# Patient Record
Sex: Female | Born: 1955 | Race: White | Hispanic: No | Marital: Married | Smoking: Former smoker
Health system: Southern US, Community
[De-identification: ages and names within clinical notes are randomized; demographics above are authoritative.]

## PROBLEM LIST (undated history)

## (undated) DIAGNOSIS — N6019 Diffuse cystic mastopathy of unspecified breast: Secondary | ICD-10-CM

## (undated) DIAGNOSIS — E669 Obesity, unspecified: Secondary | ICD-10-CM

## (undated) DIAGNOSIS — E785 Hyperlipidemia, unspecified: Secondary | ICD-10-CM

## (undated) DIAGNOSIS — Z96651 Presence of right artificial knee joint: Secondary | ICD-10-CM

## (undated) DIAGNOSIS — I4891 Unspecified atrial fibrillation: Secondary | ICD-10-CM

## (undated) DIAGNOSIS — K219 Gastro-esophageal reflux disease without esophagitis: Secondary | ICD-10-CM

## (undated) DIAGNOSIS — D649 Anemia, unspecified: Secondary | ICD-10-CM

## (undated) DIAGNOSIS — M1990 Unspecified osteoarthritis, unspecified site: Secondary | ICD-10-CM

## (undated) DIAGNOSIS — R5383 Other fatigue: Secondary | ICD-10-CM

## (undated) HISTORY — DX: Other fatigue: R53.83

## (undated) HISTORY — PX: SHOULDER SURGERY: SHX246

## (undated) HISTORY — PX: HAND SURGERY: SHX662

## (undated) HISTORY — DX: Unspecified osteoarthritis, unspecified site: M19.90

## (undated) HISTORY — DX: Anemia, unspecified: D64.9

## (undated) HISTORY — DX: Presence of right artificial knee joint: Z96.651

## (undated) HISTORY — DX: Obesity, unspecified: E66.9

## (undated) HISTORY — PX: KNEE SURGERY: SHX244

## (undated) HISTORY — DX: Hyperlipidemia, unspecified: E78.5

## (undated) HISTORY — PX: BREAST SURGERY: SHX581

## (undated) HISTORY — DX: Gastro-esophageal reflux disease without esophagitis: K21.9

## (undated) HISTORY — PX: OTHER SURGICAL HISTORY: SHX169

---
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
## 1979-10-20 HISTORY — PX: APPENDECTOMY: SHX54

## 1999-10-20 HISTORY — PX: CHOLECYSTECTOMY: SHX55

## 2006-03-22 ENCOUNTER — Ambulatory Visit: Payer: Self-pay | Admitting: Family Medicine

## 2006-04-13 ENCOUNTER — Ambulatory Visit: Payer: Self-pay | Admitting: Family Medicine

## 2006-04-26 ENCOUNTER — Encounter: Admission: RE | Admit: 2006-04-26 | Discharge: 2006-04-26 | Payer: Self-pay | Admitting: Obstetrics and Gynecology

## 2006-05-03 ENCOUNTER — Ambulatory Visit: Payer: Self-pay | Admitting: Gastroenterology

## 2006-05-10 ENCOUNTER — Encounter (INDEPENDENT_AMBULATORY_CARE_PROVIDER_SITE_OTHER): Payer: Self-pay | Admitting: *Deleted

## 2006-05-10 ENCOUNTER — Encounter: Admission: RE | Admit: 2006-05-10 | Discharge: 2006-05-10 | Payer: Self-pay | Admitting: Obstetrics and Gynecology

## 2006-08-16 ENCOUNTER — Encounter: Admission: RE | Admit: 2006-08-16 | Discharge: 2006-08-16 | Payer: Self-pay | Admitting: General Surgery

## 2007-05-02 ENCOUNTER — Encounter: Admission: RE | Admit: 2007-05-02 | Discharge: 2007-05-02 | Payer: Self-pay | Admitting: Obstetrics and Gynecology

## 2008-01-02 ENCOUNTER — Encounter
Admission: RE | Admit: 2008-01-02 | Discharge: 2008-01-02 | Payer: Self-pay | Admitting: Physical Medicine and Rehabilitation

## 2008-05-14 ENCOUNTER — Encounter: Admission: RE | Admit: 2008-05-14 | Discharge: 2008-05-14 | Payer: Self-pay | Admitting: Obstetrics and Gynecology

## 2008-10-23 ENCOUNTER — Emergency Department (HOSPITAL_COMMUNITY): Admission: EM | Admit: 2008-10-23 | Discharge: 2008-10-23 | Payer: Self-pay | Admitting: Family Medicine

## 2009-04-18 LAB — CONVERTED CEMR LAB: Pap Smear: NORMAL

## 2009-05-13 ENCOUNTER — Encounter: Admission: RE | Admit: 2009-05-13 | Discharge: 2009-05-13 | Payer: Self-pay | Admitting: Obstetrics and Gynecology

## 2009-09-23 ENCOUNTER — Ambulatory Visit: Payer: Self-pay | Admitting: Internal Medicine

## 2009-09-23 LAB — CONVERTED CEMR LAB
ALT: 24 units/L (ref 0–35)
AST: 28 units/L (ref 0–37)
Albumin: 3.7 g/dL (ref 3.5–5.2)
Alkaline Phosphatase: 75 units/L (ref 39–117)
BUN: 10 mg/dL (ref 6–23)
Basophils Absolute: 0.1 10*3/uL (ref 0.0–0.1)
Basophils Relative: 0.7 % (ref 0.0–3.0)
Bilirubin Urine: NEGATIVE
Bilirubin, Direct: 0.1 mg/dL (ref 0.0–0.3)
CO2: 29 meq/L (ref 19–32)
Calcium: 8.9 mg/dL (ref 8.4–10.5)
Chloride: 107 meq/L (ref 96–112)
Cholesterol: 192 mg/dL (ref 0–200)
Creatinine, Ser: 0.7 mg/dL (ref 0.4–1.2)
Eosinophils Absolute: 0.2 10*3/uL (ref 0.0–0.7)
Eosinophils Relative: 2.5 % (ref 0.0–5.0)
GFR calc non Af Amer: 93.65 mL/min (ref >60)
Glucose, Bld: 97 mg/dL (ref 70–99)
HCT: 40.8 % (ref 36.0–46.0)
HDL: 54.7 mg/dL (ref >39.00)
Hemoglobin, Urine: NEGATIVE
Hemoglobin: 13.7 g/dL (ref 12.0–15.0)
Ketones, ur: NEGATIVE mg/dL
LDL Cholesterol: 120 mg/dL — ABNORMAL HIGH (ref 0–99)
Leukocytes, UA: NEGATIVE
Lymphocytes Relative: 23.7 % (ref 12.0–46.0)
Lymphs Abs: 1.8 10*3/uL (ref 0.7–4.0)
MCHC: 33.7 g/dL (ref 30.0–36.0)
MCV: 93.3 fL (ref 78.0–100.0)
Monocytes Absolute: 0.5 10*3/uL (ref 0.1–1.0)
Monocytes Relative: 6.3 % (ref 3.0–12.0)
Neutro Abs: 5.1 10*3/uL (ref 1.4–7.7)
Neutrophils Relative %: 66.8 % (ref 43.0–77.0)
Nitrite: NEGATIVE
Platelets: 185 10*3/uL (ref 150.0–400.0)
Potassium: 4.4 meq/L (ref 3.5–5.1)
RBC: 4.37 M/uL (ref 3.87–5.11)
RDW: 13.6 % (ref 11.5–14.6)
Sodium: 142 meq/L (ref 135–145)
Specific Gravity, Urine: 1.025 (ref 1.000–1.030)
TSH: 2 microintl units/mL (ref 0.35–5.50)
Total Bilirubin: 0.5 mg/dL (ref 0.3–1.2)
Total CHOL/HDL Ratio: 4
Total Protein, Urine: NEGATIVE mg/dL
Total Protein: 6.2 g/dL (ref 6.0–8.3)
Triglycerides: 86 mg/dL (ref 0.0–149.0)
Urine Glucose: NEGATIVE mg/dL
Urobilinogen, UA: 0.2 (ref 0.0–1.0)
VLDL: 17.2 mg/dL (ref 0.0–40.0)
WBC: 7.7 10*3/uL (ref 4.5–10.5)
pH: 5.5 (ref 5.0–8.0)

## 2009-09-30 ENCOUNTER — Telehealth: Payer: Self-pay | Admitting: Internal Medicine

## 2009-09-30 ENCOUNTER — Ambulatory Visit: Payer: Self-pay | Admitting: Internal Medicine

## 2009-09-30 DIAGNOSIS — Z87448 Personal history of other diseases of urinary system: Secondary | ICD-10-CM | POA: Insufficient documentation

## 2009-09-30 DIAGNOSIS — Z9189 Other specified personal risk factors, not elsewhere classified: Secondary | ICD-10-CM | POA: Insufficient documentation

## 2009-09-30 DIAGNOSIS — J438 Other emphysema: Secondary | ICD-10-CM | POA: Insufficient documentation

## 2009-09-30 DIAGNOSIS — D649 Anemia, unspecified: Secondary | ICD-10-CM | POA: Insufficient documentation

## 2009-09-30 DIAGNOSIS — E669 Obesity, unspecified: Secondary | ICD-10-CM | POA: Insufficient documentation

## 2009-10-01 ENCOUNTER — Encounter (INDEPENDENT_AMBULATORY_CARE_PROVIDER_SITE_OTHER): Payer: Self-pay | Admitting: *Deleted

## 2009-10-08 ENCOUNTER — Encounter (INDEPENDENT_AMBULATORY_CARE_PROVIDER_SITE_OTHER): Payer: Self-pay | Admitting: *Deleted

## 2009-10-14 ENCOUNTER — Ambulatory Visit: Payer: Self-pay | Admitting: Gastroenterology

## 2009-10-28 ENCOUNTER — Ambulatory Visit: Payer: Self-pay | Admitting: Gastroenterology

## 2010-02-24 ENCOUNTER — Ambulatory Visit: Payer: Self-pay | Admitting: Internal Medicine

## 2010-04-18 LAB — CONVERTED CEMR LAB: Pap Smear: NORMAL

## 2010-04-18 LAB — HM MAMMOGRAPHY: HM Mammogram: NORMAL

## 2010-05-26 ENCOUNTER — Encounter: Admission: RE | Admit: 2010-05-26 | Discharge: 2010-05-26 | Payer: Self-pay | Admitting: Obstetrics and Gynecology

## 2010-05-30 ENCOUNTER — Encounter: Admission: RE | Admit: 2010-05-30 | Discharge: 2010-05-30 | Payer: Self-pay | Admitting: Obstetrics and Gynecology

## 2010-09-29 ENCOUNTER — Ambulatory Visit: Payer: Self-pay | Admitting: Internal Medicine

## 2010-10-06 ENCOUNTER — Ambulatory Visit: Payer: Self-pay | Admitting: Internal Medicine

## 2010-10-27 ENCOUNTER — Other Ambulatory Visit: Payer: Self-pay | Admitting: Internal Medicine

## 2010-10-27 LAB — CBC WITH DIFFERENTIAL/PLATELET
Basophils Absolute: 0 10*3/uL (ref 0.0–0.1)
Basophils Relative: 0.4 % (ref 0.0–3.0)
Eosinophils Absolute: 0.2 10*3/uL (ref 0.0–0.7)
Eosinophils Relative: 3.3 % (ref 0.0–5.0)
HCT: 41.4 % (ref 36.0–46.0)
Hemoglobin: 14.2 g/dL (ref 12.0–15.0)
Lymphocytes Relative: 29.2 % (ref 12.0–46.0)
Lymphs Abs: 2.2 10*3/uL (ref 0.7–4.0)
MCHC: 34.3 g/dL (ref 30.0–36.0)
MCV: 89.8 fl (ref 78.0–100.0)
Monocytes Absolute: 0.5 10*3/uL (ref 0.1–1.0)
Monocytes Relative: 6.2 % (ref 3.0–12.0)
Neutro Abs: 4.5 10*3/uL (ref 1.4–7.7)
Neutrophils Relative %: 60.9 % (ref 43.0–77.0)
Platelets: 229 10*3/uL (ref 150.0–400.0)
RBC: 4.61 Mil/uL (ref 3.87–5.11)
RDW: 13.4 % (ref 11.5–14.6)
WBC: 7.4 10*3/uL (ref 4.5–10.5)

## 2010-10-27 LAB — URINALYSIS, ROUTINE W REFLEX MICROSCOPIC
Bilirubin Urine: NEGATIVE
Hemoglobin, Urine: NEGATIVE
Ketones, ur: NEGATIVE
Nitrite: NEGATIVE
Specific Gravity, Urine: 1.015 (ref 1.000–1.030)
Total Protein, Urine: NEGATIVE
Urine Glucose: NEGATIVE
Urobilinogen, UA: 0.2 (ref 0.0–1.0)
pH: 6 (ref 5.0–8.0)

## 2010-10-27 LAB — BASIC METABOLIC PANEL
BUN: 17 mg/dL (ref 6–23)
CO2: 30 mEq/L (ref 19–32)
Calcium: 9.5 mg/dL (ref 8.4–10.5)
Chloride: 106 mEq/L (ref 96–112)
Creatinine, Ser: 0.8 mg/dL (ref 0.4–1.2)
GFR: 75.56 mL/min (ref >60.00)
Glucose, Bld: 85 mg/dL (ref 70–99)
Potassium: 4.8 mEq/L (ref 3.5–5.1)
Sodium: 142 mEq/L (ref 135–145)

## 2010-10-27 LAB — HEPATIC FUNCTION PANEL
ALT: 22 U/L (ref 0–35)
AST: 22 U/L (ref 0–37)
Albumin: 3.9 g/dL (ref 3.5–5.2)
Alkaline Phosphatase: 88 U/L (ref 39–117)
Bilirubin, Direct: 0.1 mg/dL (ref 0.0–0.3)
Total Bilirubin: 0.7 mg/dL (ref 0.3–1.2)
Total Protein: 6.9 g/dL (ref 6.0–8.3)

## 2010-10-27 LAB — LIPID PANEL
Cholesterol: 230 mg/dL — ABNORMAL HIGH (ref 0–200)
HDL: 81.7 mg/dL (ref >39.00)
Total CHOL/HDL Ratio: 3
Triglycerides: 75 mg/dL (ref 0.0–149.0)
VLDL: 15 mg/dL (ref 0.0–40.0)

## 2010-10-27 LAB — TSH: TSH: 1.51 u[IU]/mL (ref 0.35–5.50)

## 2010-10-27 LAB — LDL CHOLESTEROL, DIRECT: Direct LDL: 141.1 mg/dL

## 2010-11-03 ENCOUNTER — Encounter: Payer: Self-pay | Admitting: Internal Medicine

## 2010-11-12 ENCOUNTER — Telehealth: Payer: Self-pay | Admitting: Internal Medicine

## 2010-11-18 NOTE — Progress Notes (Signed)
Summary: adipex-p  Phone Note Call from Patient   Caller: Patient Summary of Call: Req rx for appetite suppresant. Was rx by previous md. pt not sure of name. Will contact [employer] in [place] for more info Initial call taken by: Orlan Leavens,  September 30, 2009 8:55 AM  Follow-up for Phone Call        prescription for Adipex-p 37.5 done - pt will need to pick yp as can't be called in - thanks Follow-up by: Newt Lukes MD,  September 30, 2009 10:26 AM  Additional Follow-up for Phone Call Additional follow up Details #1::        Called pt no ansew [facility] RTC concerning prescription Additional Follow-up by: Orlan Leavens,  September 30, 2009 11:07 AM    Additional Follow-up for Phone Call Additional follow up Details #2::    Patient return call back. Rx ready for pick-up. will leave @ front desk Follow-up by: Orlan Leavens,  September 30, 2009 11:44 AM

## 2010-11-18 NOTE — Assessment & Plan Note (Signed)
Summary: NEW [employer] PHYSICAL PT--PKG--STC   Vital Signs:  Patient profile:   55 year old female Height:      64.5 inches (163.83 cm) Weight:      195.4 pounds (88.82 kg) BMI:     33.14 O2 Sat:      94 % on Room air Temp:     97.5 degrees F (36.39 degrees C) oral Pulse rate:   61 / minute BP sitting:   110 / 90  (left arm) Cuff size:   large  Vitals Entered By: Orlan Leavens (September 30, 2009 8:22 AM)  O2 Flow:  Room air CC: New patient CPX Is Patient Diabetic? No Pain Assessment Patient in pain? no        Primary Care Provider:  Newt Lukes MD  CC:  New patient CPX.  History of Present Illness: new pt to me and ourpractice - here to est care - patient is also here today for annual physical. Patient feels well and has no complaints.   intrested in "weight loss drug" that she was prescribed this summer - lost 20# with this med (intentional) that she has kept off -  but no longer going to the weight loss clinic as it was not covered by insurance -  still would like to take that med but uncertain what the drug was....  Preventive Screening-Counseling & Management  Alcohol-Tobacco     Alcohol drinks/day: <1     Alcohol Counseling: not indicated; use of alcohol is not excessive or problematic     Smoking Status: quit     Year Quit: 2001  Caffeine-Diet-Exercise     Caffeine use/day: 1-2     Caffeine Counseling: not indicated; caffeine use is not excessive or problematic     Diet Counseling: to improve diet; diet is suboptimal     Does Patient Exercise: no     Exercise Counseling: to improve exercise regimen     Depression Counseling: not indicated; screening negative for depression  Clinical Review Panels:  Prevention   Last Mammogram:  normal (04/18/2009)   Last Pap Smear:  normal (04/18/2009)  Immunizations   Last Tetanus Booster:  Tdap (09/30/2009)  Lipid Management   Cholesterol:  192 (09/23/2009)   LDL (bad choesterol):  120 (09/23/2009)   HDL  (good cholesterol):  16.10 (09/23/2009)  CBC   WBC:  7.7 (09/23/2009)   RBC:  4.37 (09/23/2009)   Hgb:  13.7 (09/23/2009)   Hct:  40.8 (09/23/2009)   Platelets:  185.0 (09/23/2009)   MCV  93.3 (09/23/2009)   MCHC  33.7 (09/23/2009)   RDW  13.6 (09/23/2009)   PMN:  66.8 (09/23/2009)   Lymphs:  23.7 (09/23/2009)   Monos:  6.3 (09/23/2009)   Eosinophils:  2.5 (09/23/2009)   Basophil:  0.7 (09/23/2009)  Complete Metabolic Panel   Glucose:  97 (09/23/2009)   Sodium:  142 (09/23/2009)   Potassium:  4.4 (09/23/2009)   Chloride:  107 (09/23/2009)   CO2:  29 (09/23/2009)   BUN:  10 (09/23/2009)   Creatinine:  0.7 (09/23/2009)   Albumin:  3.7 (09/23/2009)   Total Protein:  6.2 (09/23/2009)   Calcium:  8.9 (09/23/2009)   Total Bili:  0.5 (09/23/2009)   Alk Phos:  75 (09/23/2009)   SGPT (ALT):  24 (09/23/2009)   SGOT (AST):  28 (09/23/2009)   Current Medications (verified): 1)  Icaps  Caps (Multiple Vitamins-Minerals) .... Take 2 By Mouth Qd 2)  Estrace 1 Mg Tabs (Estradiol) .... Take 1  Po Qd  Allergies (verified): No Known Drug Allergies  Past History:  Past Medical History: Anemia-NOS Blood transfusion  MD rooster: gyn- [facility] - [facility]  Past Surgical History: Appendectomy (1610) Cholecystectomy (2001) Tonsillectomy (1972) Breast biopsy (2006)  Family History: Family History of Arthritis (grandparent &parent) Family History Breast cancer 1st degree relative <50 (other relative) Family History of Colon CA 1st degree relative <60 (parent) Family History Diabetes 1st degree relative (parent & grandparent) Family History High cholesterol (parent) Family History Ovarian cancer (other relative) Heart disease (parent)  mom - A&W dad expired age 14yo - DM, brain anyresym sister - CLL dx age 20yo brother - CLL dx age 93yo  Social History: Former Smoker married, lives with spouse (who is being tx for rectal cancer - Sherril) works as Administrator, sports  occasional social alcohol use Smoking Status:  quit Caffeine use/day:  1-2 Does Patient Exercise:  no  Review of Systems       see HPI above. I have reviewed all other systems and they were negative.   Physical Exam  General:  alert, well-developed, well-nourished, and cooperative to examination.   mild-mod overweight appearing Eyes:  vision grossly intact; pupils equal, round and reactive to light.  conjunctiva and lids normal.    Ears:  normal pinnae bilaterally, without erythema, swelling, or tenderness to palpation. TMs clear, without effusion, or cerumen impaction. Hearing grossly normal bilaterally  Mouth:  teeth and gums in good repair; mucous membranes moist, without lesions or ulcers. oropharynx clear without exudate, erythema.  Neck:  supple, full ROM, no masses, no thyromegaly; no thyroid nodules or tenderness. no JVD or carotid bruits.   Lungs:  normal respiratory effort, no intercostal retractions or use of accessory muscles; normal breath sounds bilaterally - no crackles and no wheezes.    Heart:  normal rate, regular rhythm, no murmur, and no rub. BLE without edema. normal DP pulses and normal cap refill in all 4 extremities    Abdomen:  soft, non-tender, normal bowel sounds, no distention; no masses and no appreciable hepatomegaly or splenomegaly.   Genitalia:  defer to gyn Msk:  No deformity or scoliosis noted of thoracic or lumbar spine.   Neurologic:  alert & oriented X3 and cranial nerves II-XII symetrically intact.  strength normal in all extremities, sensation intact to light touch, and gait normal. speech fluent without dysarthria or aphasia; follows commands with good comprehension.  Skin:  no rashes, vesicles, ulcers, or erythema. No nodules or irregularity to palpation.  Psych:  Oriented X3, memory intact for recent and remote, normally interactive, good eye contact, not anxious appearing, not depressed appearing, and not agitated.      Impression &  Recommendations:  Problem # 1:  PREVENTIVE HEALTH CARE (ICD-V70.0) Patient has been counseled on age-appropriate routine health concerns for screening and prevention. These are reviewed and up-to-date. Immunizations are up-to-date or declined. Labs and ECG reviewed.  Orders: EKG w/ Interpretation (93000) Gastroenterology Referral (GI)  Problem # 2:  OBESITY (ICD-278.00) will investigate weight loss rx and rx if approp as per request - if rx, need 6 mo followup to ensure not abused and monitor weight loss efforts also encouraged: it is important that you work on losing weight - monitor your diet and consume fewer calories such as less carbohydrates (sugar) and less fat. you also need to increase your physical activity level - start by walking for 10-20 minutes 3 times per week and work up to 30 minutes 4-5 times each week.  Ht: 64.5 (09/30/2009)   Wt: 195.4 (09/30/2009)   BMI: 33.14 (09/30/2009)  reviewed prev records: ok for adipex-p 37.5 once daily - rx to be picked up  Complete Medication List: 1)  Icaps Caps (Multiple vitamins-minerals) .... Take 2 by mouth qd 2)  Estrace 1 Mg Tabs (Estradiol) .... Take 1 po qd 3)  Phentermine Hcl 37.5 Mg Caps (Phentermine hcl) .Marland Kitchen.. 1 by mouth every morning  Other Orders: Tdap => 62yrs IM ([address]) Admin 1st Vaccine (60454)  Contraindications/Deferment of Procedures/Staging:    Test/Procedure: FLU VAX    Reason for deferment: patient declined   Patient Instructions: 1)  it was good to see you today.  2)  Your exam, labs and EKG (heart) all look good today! 3)  we'll contact [employer] to investigate the previously prescribed weight loss drug and then contact you re: wether or not this is approved here - 4)  it is important that you work on losing weight by monitoring your diet and consuming fewer calories such as less carbohydrates (sugar) and less fat. you also need to increase your physical activity level - start by walking for 10-20 minutes 3  times per week and work up to 30 minutes 4-5 times each week.  5)  we'll make referral to GI for screening colonscopy. Our office will contact you regarding this appointment once made.  6)  It is important that you exercise regularly at least 20 minutes 5 times a week. If you develop chest pain, have severe difficulty breathing, or feel very tired , stop exercising immediately and seek medical attention. 7)  Take calcium +Vitamin D daily. 8)  Please schedule a follow-up appointment in 6 months, sooner if problems.  Prescriptions: PHENTERMINE HCL 37.5 MG CAPS (PHENTERMINE HCL) 1 by mouth every morning  #30 x 5   Entered and Authorized by:   Newt Lukes MD   Signed by:   Newt Lukes MD on 09/30/2009   Method used:   Print then Give to Patient   RxID:   [id number]   Preventive Care Screening  Mammogram:    Date:  04/18/2009    Results:  normal   Pap Smear:    Date:  04/18/2009    Results:  normal     Immunizations Administered:  Tetanus Vaccine:    Vaccine Type: Tdap    Site: right deltoid    Mfr: GlaxoSmithKline    Dose: 0.5 ml    Route: IM    Given by: Orlan Leavens    Exp. Date: 12/14/2011    Lot #: [id number]    VIS given: 09/30/09

## 2010-11-18 NOTE — Procedures (Signed)
Summary: Colonoscopy  Patient: [name] Note: All result statuses are Final unless otherwise noted.  Tests: (1) Colonoscopy (COL)   COL Colonoscopy           DONE     [facility]     [address]     [place], [place]  [address]           COLONOSCOPY PROCEDURE REPORT           PATIENT:  Diane Perkins, Diane Perkins  MR#:  [id number]     BIRTHDATE:  [birth year], 53 yrs. old  GENDER:  female           ENDOSCOPIST:  Barbette Hair. Arlyce Dice, MD     Referred by:  Rene Paci, M.D.           PROCEDURE DATE:  10/28/2009     PROCEDURE:  Colonoscopy, Diagnostic     ASA CLASS:  Class II     INDICATIONS:  Routine Risk Screening           MEDICATIONS:   Fentanyl 100 mcg IV, Versed 9 mg IV, Benadryl 25 mg     IV           DESCRIPTION OF PROCEDURE:   After the risks benefits and     alternatives of the procedure were thoroughly explained, informed     consent was obtained.  Digital rectal exam was performed and     revealed no abnormalities.   The LB CF-[id number] endoscope     was introduced through the anus and advanced to the cecum, which     was identified by both the appendix and ileocecal valve, without     limitations.  The quality of the prep was excellent, using     MoviPrep.  The instrument was then slowly withdrawn as the colon     was fully examined.     <<PROCEDUREIMAGES>>           FINDINGS:  Moderate diverticulosis was found in the sigmoid colon     (see image1).  This was otherwise a normal examination of the     colon (see image3, image4, image6, image9, image10, image13,     image12, image19, and image20).   Retroflexed views in the rectum     revealed not done.    The scope was then withdrawn from the     patient and the procedure completed.           COMPLICATIONS:  None           ENDOSCOPIC IMPRESSION:     1) Moderate diverticulosis in the sigmoid colon     2) Otherwise normal examination     3) Not done     RECOMMENDATIONS:     1) Continue current colorectal  screening recommendations for     "routine risk" patients with a repeat colonoscopy in 10 years.           REPEAT EXAM:  In 10 year(s) for Colonoscopy.           ______________________________     Barbette Hair. Arlyce Dice, MD           CC:           n.     eSIGNED:   Barbette Hair. Etienne Mowers at 10/28/2009 12:21 PM           [place]enson [place], [id number]  Note: An exclamation mark (!) indicates a result that was not dispersed into the flowsheet.  Document Creation Date: 10/28/2009 12:22 PM _______________________________________________________________________  (1) Order result status: Final Collection or observation date-time: 10/28/2009 12:17 Requested date-time:  Receipt date-time:  Reported date-time:  Referring Physician:   Ordering Physician: Melvia Heaps [phone]) Specimen Source:  Source: Launa Grill Order Number: [id number] Lab site:   Appended Document: Colonoscopy    Clinical Lists Changes  Observations: Added new observation of COLONNXTDUE: 10/2019 (10/28/2009 12:28)

## 2010-11-18 NOTE — Assessment & Plan Note (Signed)
Summary: 6 MTH OR SOONER FU  STC   Vital Signs:  Patient profile:   55 year old female Height:      64.5 inches (163.83 cm) Weight:      179.0 pounds (81.36 kg) BMI:     30.36 O2 Sat:      96 % on Room air Temp:     98.1 degrees F (36.72 degrees C) oral Pulse rate:   80 / minute BP sitting:   102 / 82  (left arm) Cuff size:   regular  Vitals Entered By: Orlan Leavens (Feb 24, 2010 1:08 PM)  O2 Flow:  Room air CC: 6 month follow-up Is Patient Diabetic? No Pain Assessment Patient in pain? no        Primary Care Provider:  Newt Lukes MD  CC:  6 month follow-up.  History of Present Illness: f/u obesity -  has lost 15lbs since 09/2009 with help of phenteramine - denies adv se trying to focus on "good carbs"  Clinical Review Panels:  Prevention   Last Mammogram:  normal (04/18/2009)   Last Pap Smear:  normal (04/18/2009)   Last Colonoscopy:  DONE (10/28/2009)  Lipid Management   Cholesterol:  192 (09/23/2009)   LDL (bad choesterol):  120 (09/23/2009)   HDL (good cholesterol):  54.70 (09/23/2009)  CBC   WBC:  7.7 (09/23/2009)   RBC:  4.37 (09/23/2009)   Hgb:  13.7 (09/23/2009)   Hct:  40.8 (09/23/2009)   Platelets:  185.0 (09/23/2009)   MCV  93.3 (09/23/2009)   MCHC  33.7 (09/23/2009)   RDW  13.6 (09/23/2009)   PMN:  66.8 (09/23/2009)   Lymphs:  23.7 (09/23/2009)   Monos:  6.3 (09/23/2009)   Eosinophils:  2.5 (09/23/2009)   Basophil:  0.7 (09/23/2009)  Complete Metabolic Panel   Glucose:  97 (09/23/2009)   Sodium:  142 (09/23/2009)   Potassium:  4.4 (09/23/2009)   Chloride:  107 (09/23/2009)   CO2:  29 (09/23/2009)   BUN:  10 (09/23/2009)   Creatinine:  0.7 (09/23/2009)   Albumin:  3.7 (09/23/2009)   Total Protein:  6.2 (09/23/2009)   Calcium:  8.9 (09/23/2009)   Total Bili:  0.5 (09/23/2009)   Alk Phos:  75 (09/23/2009)   SGPT (ALT):  24 (09/23/2009)   SGOT (AST):  28 (09/23/2009)   Current Medications (verified): 1)  Icaps  Caps  (Multiple Vitamins-Minerals) .... Take 2 By Mouth Qd 2)  Estrace 1 Mg Tabs (Estradiol) .... Take 1/2 By Mouth Once Daily 3)  Phentermine Hcl 37.5 Mg Caps (Phentermine Hcl) .Marland Kitchen.. 1 By Mouth Every Morning  Allergies (verified): No Known Drug Allergies  Past History:  Past Medical History: Anemia-NOS  MD rooster: gyn- [facility]  Social History: Reviewed history from 09/30/2009 and no changes required. Former Smoker married, lives with spouse (who is being tx for rectal cancer - Sherril) works as Administrator, sports occasional social alcohol use  Review of Systems  The patient denies anorexia, weight gain, chest pain, syncope, and depression.    Physical Exam  General:  alert, well-developed, well-nourished, and cooperative to examination.   mild overweight appearing Lungs:  normal respiratory effort, no intercostal retractions or use of accessory muscles; normal breath sounds bilaterally - no crackles and no wheezes.    Heart:  normal rate, regular rhythm, no murmur, and no rub. BLE without edema. normal DP pulses and normal cap refill in all 4 extremities      Impression &  Recommendations:  Problem # 1:  OBESITY (ICD-278.00)  s/p 6 mo tx phenteramine  stop at this time as long term effects unknown i offered nutritionist referral - pt declines also encouraged: it is important that you continue top work on losing weight - monitor your diet and consume fewer calories such as less carbohydrates (sugar) and less fat. you also need to increase your physical activity level - continue walking for 10-20 minutes 3 times per week and work up to 30 minutes 4-5 times each week.  Ht: 64.5 (09/30/2009)   Wt: 195.4 (09/30/2009)   BMI: 33.14 (09/30/2009)  Ht: 64.5 (02/24/2010)   Wt: 179.0 (02/24/2010)   BMI: 30 (02/24/2010)  Complete Medication List: 1)  Icaps Caps (Multiple vitamins-minerals) .... Take 2 by mouth qd 2)  Estrace 1 Mg Tabs (Estradiol) .... Take 1/2 by  mouth once daily  Patient Instructions: 1)  it was good to see you today.  2)  stop phenteramine at this time - continue to focus on your diet and exercise habits - let us know if you are interested in a nutrition referral 3)  Please schedule a follow-up appointment in 6 months for your medical physical, sooner if problems.

## 2010-11-18 NOTE — Letter (Signed)
Summary: [facility] Instructions  [facility]  [address] [place], [place] [address]   Phone: [phone]  Fax: [phone]       Diane Perkins    1956/03/10    MRN: [id number]        Procedure Day /Date: 10/28/2009  Monday     Arrival Time: 10:30am      Procedure Time: 11:30am     Location of Procedure:                    _x _  [facility] (4th Floor)                        PREPARATION FOR COLONOSCOPY WITH MOVIPREP   Starting 5 days prior to your procedure 01/05/11do not eat nuts, seeds, popcorn, corn, beans, peas,  salads, or any raw vegetables.  Do not take any fiber supplements (e.g. Metamucil, Citrucel, and Benefiber).  THE DAY BEFORE YOUR PROCEDURE         DATE: 10/27/2009  DAY: Sunday  1.  Drink clear liquids the entire day-NO SOLID FOOD  2.  Do not drink anything colored red or purple.  Avoid juices with pulp.  No orange juice.  3.  Drink at least 64 oz. (8 glasses) of fluid/clear liquids during the day to prevent dehydration and help the prep work efficiently.  CLEAR LIQUIDS INCLUDE: Water Jello Ice Popsicles Tea (sugar ok, no milk/cream) Powdered fruit flavored drinks Coffee (sugar ok, no milk/cream) Gatorade Juice: apple, white grape, white cranberry  Lemonade Clear bullion, consomm, broth Carbonated beverages (any kind) Strained chicken noodle soup Hard Candy                             4.  In the morning, mix first dose of MoviPrep solution:    Empty 1 Pouch A and 1 Pouch B into the disposable container    Add lukewarm drinking water to the top line of the container. Mix to dissolve    Refrigerate (mixed solution should be used within 24 hrs)  5.  Begin drinking the prep at 5:00 p.m. The MoviPrep container is divided by 4 marks.   Every 15 minutes drink the solution down to the next mark (approximately 8 oz) until the full liter is complete.   6.  Follow completed prep with 16 oz of clear liquid of your choice  (Nothing red or purple).  Continue to drink clear liquids until bedtime.  7.  Before going to bed, mix second dose of MoviPrep solution:    Empty 1 Pouch A and 1 Pouch B into the disposable container    Add lukewarm drinking water to the top line of the container. Mix to dissolve    Refrigerate  THE DAY OF YOUR PROCEDURE      DATE: 10/28/09 DAY: Monday  Beginning at 6:30 a.m. (5 hours before procedure):         1. Every 15 minutes, drink the solution down to the next mark (approx 8 oz) until the full liter is complete.  2. Follow completed prep with 16 oz. of clear liquid of your choice.    3. You may drink clear liquids until  9:30am (2 HOURS BEFORE PROCEDURE).   MEDICATION INSTRUCTIONS  Unless otherwise instructed, you should take regular prescription medications with a small sip of water   as early as possible the morning of your  procedure.           OTHER INSTRUCTIONS  You will need a responsible adult at least 53 years of age to accompany you and drive you home.   This person must remain in the waiting room during your procedure.  Wear loose fitting clothing that is easily removed.  Leave jewelry and other valuables at home.  However, you may wish to bring a book to read or  an iPod/MP3 player to listen to music as you wait for your procedure to start.  Remove all body piercing jewelry and leave at home.  Total time from sign-in until discharge is approximately 2-3 hours.  You should go home directly after your procedure and rest.  You can resume normal activities the  day after your procedure.  The day of your procedure you should not:   Drive   Make legal decisions   Operate machinery   Drink alcohol   Return to work  You will receive specific instructions about eating, activities and medications before you leave.    The above instructions have been reviewed and explained to me by   Sherren Kerns RN  October 14, 2009 1:06 PM     I fully  understand and can verbalize these instructions _____________________________ Date _________

## 2010-11-18 NOTE — Letter (Signed)
Summary: Previsit letter  [facility]  [address] [place], [place] [address]   Phone: [phone]  Fax: [phone]       10/01/2009 MRN: [id number]  Diane Perkins [address] [place], [place]  [address]  Dear Ms. Guilliams,  Welcome to the Gastroenterology Division at [facility].    You are scheduled to see a nurse for your pre-procedure visit on 10/14/09 at 1:00p.m. on the 3rd floor at [facility], [address]  We ask that you try to arrive at our office 15 minutes prior to your appointment time to allow for check-in.  Your nurse visit will consist of discussing your medical and surgical history, your immediate family medical history, and your medications.    Please bring a complete list of all your medications or, if you prefer, bring the medication bottles and we will list them.  We will need to be aware of both prescribed and over the counter drugs.  We will need to know exact dosage information as well.  If you are on blood thinners (Coumadin, Plavix, Aggrenox, Ticlid, etc.) please call our office today/prior to your appointment, as we need to consult with your physician about holding your medication.   Please be prepared to read and sign documents such as consent forms, a financial agreement, and acknowledgement forms.  If necessary, and with your consent, a friend or relative is welcome to sit-in on the nurse visit with you.  Please bring your insurance card so that we may make a copy of it.  If your insurance requires a referral to see a specialist, please bring your referral form from your primary care physician.  No co-pay is required for this nurse visit.     If you cannot keep your appointment, please call [phone] to cancel or reschedule prior to your appointment date.  This allows [place] the opportunity to schedule an appointment for another patient in need of care.    Thank you for choosing [facility] for your medical  needs.  We appreciate the opportunity to care for you.  Please visit [place] at our website  to learn more about our practice.                     Sincerely.                                                                                                                   The Gastroenterology Division

## 2010-11-20 NOTE — Progress Notes (Signed)
Summary: Rx req?  Phone Note Call from Patient Call back at Home Phone [phone]   Caller: Patient Summary of Call: Pt called stating she has started an low calorie diet and an exercise program. Pt says she was told that VAL would prescribe Rx to "boost metabolism" once she started diet and exercise, please advise. [employer] [place] Initial call taken by: Margaret Pyle, CMA,  November 12, 2010 2:16 PM  Follow-up for Phone Call        needs appt/OV to further discuss poss med rx (phentermine) due to controlled subst nature and review of poss risk/SE - thx Follow-up by: Newt Lukes MD,  November 12, 2010 3:55 PM  Additional Follow-up for Phone Call Additional follow up Details #1::        Pt advised via VM to make ROV to discuss Rx Additional Follow-up by: Margaret Pyle, CMA,  November 13, 2010 8:17 AM

## 2010-11-20 NOTE — Assessment & Plan Note (Signed)
Summary: PHYSICAL--STC   Vital Signs:  Patient profile:   55 year old female Height:      64.5 inches (163.83 cm) Weight:      205.2 pounds (93.27 kg) O2 Sat:      99 % on Room air Temp:     97.9 degrees F (36.61 degrees C) oral Pulse rate:   63 / minute BP sitting:   100 / 82  (left arm) Cuff size:   large  Vitals Entered By: Orlan Leavens RMA (November 03, 2010 9:28 AM)  O2 Flow:  Room air CC: CPX Is Patient Diabetic? No Pain Assessment Patient in pain? no        Primary Care Provider:  Newt Lukes MD  CC:  CPX.  History of Present Illness: patient is here today for annual physical. Patient feels well and has no complaints.   Preventive Screening-Counseling & Management  Alcohol-Tobacco     Alcohol drinks/day: <1     Alcohol Counseling: not indicated; use of alcohol is not excessive or problematic     Smoking Status: quit     Year Quit: 2001     Tobacco Counseling: to remain off tobacco products  Caffeine-Diet-Exercise     Caffeine use/day: 1-2     Caffeine Counseling: not indicated; caffeine use is not excessive or problematic     Diet Counseling: to improve diet; diet is suboptimal     Does Patient Exercise: no     Exercise Counseling: to improve exercise regimen     Depression Counseling: not indicated; screening negative for depression  Safety-Violence-Falls     Seat Belt Counseling: not indicated; patient wears seat belts     Helmet Counseling: not indicated; patient wears helmet when riding bicycle/motocycle     Violence Counseling: not indicated; no violence risk noted     Fall Risk Counseling: not indicated; no significant falls noted  Clinical Review Panels:  Prevention   Last Mammogram:  No specific mammographic evidence of malignancy.  [facility]    (04/18/2010)   Last Pap Smear:  normal (taavon) (04/18/2010)   Last Colonoscopy:  DONE (10/28/2009)  Immunizations   Last Tetanus Booster:  Tdap  (09/30/2009)  Lipid Management   Cholesterol:  230 (10/27/2010)   LDL (bad choesterol):  120 (09/23/2009)   HDL (good cholesterol):  81.70 (10/27/2010)  CBC   WBC:  7.4 (10/27/2010)   RBC:  4.61 (10/27/2010)   Hgb:  14.2 (10/27/2010)   Hct:  41.4 (10/27/2010)   Platelets:  229.0 (10/27/2010)   MCV  89.8 (10/27/2010)   MCHC  34.3 (10/27/2010)   RDW  13.4 (10/27/2010)   PMN:  60.9 (10/27/2010)   Lymphs:  29.2 (10/27/2010)   Monos:  6.2 (10/27/2010)   Eosinophils:  3.3 (10/27/2010)   Basophil:  0.4 (10/27/2010)  Complete Metabolic Panel   Glucose:  85 (10/27/2010)   Sodium:  142 (10/27/2010)   Potassium:  4.8 (10/27/2010)   Chloride:  106 (10/27/2010)   CO2:  30 (10/27/2010)   BUN:  17 (10/27/2010)   Creatinine:  0.8 (10/27/2010)   Albumin:  3.9 (10/27/2010)   Total Protein:  6.9 (10/27/2010)   Calcium:  9.5 (10/27/2010)   Total Bili:  0.7 (10/27/2010)   Alk Phos:  88 (10/27/2010)   SGPT (ALT):  22 (10/27/2010)   SGOT (AST):  22 (10/27/2010)   Current Medications (verified): 1)  Icaps  Caps (Multiple Vitamins-Minerals) .... Take 2 By Mouth Qd  Allergies (verified): No Known  Drug Allergies  Past History:  Past medical, surgical, family and social histories (including risk factors) reviewed, and no changes noted (except as noted below).  Past Medical History: Anemia-NOS  MD roster: gyn- [facility] - [facility]  Past Surgical History: Reviewed history from 09/30/2009 and no changes required. Appendectomy (1610) Cholecystectomy (2001) Tonsillectomy (1972) Breast biopsy (2006)  Family History: Reviewed history from 09/30/2009 and no changes required. Family History of Arthritis (grandparent &parent) Family History Breast cancer 1st degree relative <50 (other relative) Family History of Colon CA 1st degree relative <60 (parent) Family History Diabetes 1st degree relative (parent & grandparent) Family History High cholesterol (parent) Family History  Ovarian cancer (other relative) Heart disease (parent)  mom - A&W dad expired age 69yo - DM, brain anuersym sister - CLL dx age 18yo  brother - CLL dx age 47yo   Social History: Reviewed history from 09/30/2009 and no changes required. Former Smoker, quit 2001 married, lives with spouse (who is s/p tx rectal cancer - Sherril) works as Administrator, sports occasional social alcohol use  Review of Systems       see HPI above. I have reviewed all other systems and they were negative.   Physical Exam  General:  alert, well-developed, well-nourished, and cooperative to examination.   overweight appearing Head:  Normocephalic and atraumatic without obvious abnormalities. No apparent alopecia or balding. Eyes:  vision grossly intact; pupils equal, round and reactive to light.  conjunctiva and lids normal.    Ears:  normal pinnae bilaterally, without erythema, swelling, or tenderness to palpation. TMs clear, without effusion, or cerumen impaction. Hearing grossly normal bilaterally  Mouth:  teeth and gums in good repair; mucous membranes moist, without lesions or ulcers. oropharynx clear without exudate, no erythema.  Neck:  supple, full ROM, no masses, no thyromegaly; no thyroid nodules or tenderness. no JVD or carotid bruits.   Lungs:  normal respiratory effort, no intercostal retractions or use of accessory muscles; normal breath sounds bilaterally - no crackles and no wheezes.    Heart:  normal rate, regular rhythm, no murmur, and no rub. BLE without edema. normal DP pulses and normal cap refill in all 4 extremities    Abdomen:  soft, non-tender, normal bowel sounds, no distention; no masses and no appreciable hepatomegaly or splenomegaly.   Genitalia:  defer to gyn Msk:  No deformity or scoliosis noted of thoracic or lumbar spine.   Neurologic:  alert & oriented X3 and cranial nerves II-XII symetrically intact.  strength normal in all extremities, sensation intact to light touch, and gait  normal. speech fluent without dysarthria or aphasia; follows commands with good comprehension.  Skin:  no rashes, vesicles, ulcers, or erythema. No nodules or irregularity to palpation.  Psych:  Oriented X3, memory intact for recent and remote, normally interactive, good eye contact, not anxious appearing, not depressed appearing, and not agitated.      Impression & Recommendations:  Problem # 1:  PREVENTIVE HEALTH CARE (ICD-V70.0) Patient has been counseled on age-appropriate routine health concerns for screening and prevention. These are reviewed and up-to-date. Immunizations are up-to-date or declined. Labs and ECG reviewed.  Orders: EKG w/ Interpretation (93000)  Complete Medication List: 1)  Icaps Caps (Multiple vitamins-minerals) .... Take 2 by mouth qd  Patient Instructions: 1)  it was good to see you today.  2)  exam and EKG (heart) looks good - and labs reviewed - these look good too 3)  continue to focus on your diet and exercise habits -  let us know if you are interested in a nutrition referral 4)  Please schedule a follow-up appointment annually for your medical physical and labs, call sooner if problems.    Orders Added: 1)  EKG w/ Interpretation [93000] 2)  Est. Patient 40-64 years [99396]     Pap Smear  Procedure date:  04/18/2010  Findings:      normal (taavon)  Mammogram  Procedure date:  04/18/2010  Findings:      No specific mammographic evidence of malignancy.  Location: [facility].

## 2011-03-06 NOTE — Assessment & Plan Note (Signed)
[facility]                           GASTROENTEROLOGY OFFICE NOTE   Diane Perkins, Diane Perkins                        MRN:          [id number]  DATE:05/03/2006                            DOB:          1956/05/17    PROBLEM:  Reflux.  Mrs. Diane Perkins is a pleasant 55 year old white female referred  through the courtesy of Dr. Laury Axon for evaluation.  For years she has been  taking Prevacid for indigestion.  Over the last six to seven months symptoms  have worsened, particularly at night after retiring.  She has been sleeping  in a recliner for the past three months because of symptoms.  She has  complained of right chest pain and pain across her upper abdomen.  She no  longer has heartburn, per se.  She denies dysphagia, hoarseness, or sore  throat.   PAST MEDICAL HISTORY:  Remarkable for spinal fusion at L4-5 and S1.  She is  status post cholecystectomy, tubal ligation, and appendectomy.   FAMILY HISTORY:  Pertinent for father with heart disease.   MEDICATIONS:  Prevacid and Femhrt and Equate allergy medicine.   She has no allergies.  She neither smokes nor drinks.  She is married.  Works as a Production designer, theatre/television/film at a [employer].   REVIEW OF SYSTEMS:  Was reviewed and was negative.   PHYSICAL EXAMINATION:  VITAL SIGNS:  Pulse 78, blood pressure 110/74, weight  169.   HEENT: EOMI. PERRLA. Sclerae are anicteric.  Conjunctivae are pink.   NECK:  Supple without thyromegaly, adenopathy or carotid bruits.   CHEST:  Clear to auscultation and percussion without adventitious sounds.   CARDIAC:  Regular rhythm; normal S1 S2.  There are no murmurs, gallops or  rubs.   ABDOMEN:  Bowel sounds are normoactive.  Abdomen is soft, non-tender and non-  distended.  There are no abdominal masses, tenderness, splenic enlargement  or hepatomegaly.   EXTREMITIES:  Full range of motion.  No cyanosis, clubbing or edema.   RECTAL:  Deferred.   IMPRESSION:  Longstanding  gastroesophageal reflux disease with breakthrough  nocturnal symptoms.  Barrett's esophagus ought to be ruled out.   RECOMMENDATION:  1.  Trial of Zegerid 40 mg nightly.  2.  Upper endoscopy.                                   Barbette Hair. Arlyce Dice, MD, [facility]   RDK/MedQ  DD:  05/03/2006  DT:  05/03/2006  Job #:  [id number]   cc:   Loreen Freud, MD  Lenoard Aden, MD

## 2011-05-11 ENCOUNTER — Other Ambulatory Visit: Payer: Self-pay | Admitting: Obstetrics and Gynecology

## 2011-05-11 DIAGNOSIS — Z1231 Encounter for screening mammogram for malignant neoplasm of breast: Secondary | ICD-10-CM

## 2011-06-01 ENCOUNTER — Ambulatory Visit: Payer: Self-pay

## 2011-06-02 ENCOUNTER — Encounter: Payer: Self-pay | Admitting: Internal Medicine

## 2011-06-03 ENCOUNTER — Encounter: Payer: Self-pay | Admitting: Sports Medicine

## 2011-06-03 ENCOUNTER — Ambulatory Visit
Admission: RE | Admit: 2011-06-03 | Discharge: 2011-06-03 | Disposition: A | Discharge status: Home / Self Care | Payer: Managed Care, Other (non HMO) | Source: Ambulatory Visit | Attending: Obstetrics and Gynecology | Admitting: Obstetrics and Gynecology

## 2011-06-03 ENCOUNTER — Ambulatory Visit (INDEPENDENT_AMBULATORY_CARE_PROVIDER_SITE_OTHER): Payer: Managed Care, Other (non HMO) | Admitting: Sports Medicine

## 2011-06-03 VITALS — BP 141/87 | Ht 65.0 in | Wt 195.0 lb

## 2011-06-03 DIAGNOSIS — M6749 Ganglion, multiple sites: Secondary | ICD-10-CM | POA: Insufficient documentation

## 2011-06-03 DIAGNOSIS — Z1231 Encounter for screening mammogram for malignant neoplasm of breast: Secondary | ICD-10-CM

## 2011-06-03 DIAGNOSIS — M6789 Other specified disorders of synovium and tendon, multiple sites: Secondary | ICD-10-CM | POA: Insufficient documentation

## 2011-06-03 NOTE — Assessment & Plan Note (Addendum)
This is too small and too close to the posterior tibialis tendon to aspirate and inject. She may use compression wrap that she has at home. She is also to take her meloxicam or ibuprofen daily. I also provided her with scaphoid pads to place in her shoe. I would like her to wear the compression for 8 weeks, before reassessment.

## 2011-06-03 NOTE — Progress Notes (Signed)
  Subjective:    Patient ID: Diane Perkins, female    DOB: Dec 09, 1955, 55 y.o.   MRN: [id number]  HPI The patient comes to see [place] as a referral from Dr. Thurston Hole with a three-month history of right medial ankle pain and swelling over the malleolus. He evaluated her with x-rays which were negative and she was referred here for imaging to determine if a ganglion cyst was present. She was prescribed Mobic which she has not yet picked up and we takes ibuprofen as needed.  Past medical history: Reviewed and no changes needed Past surgical history: Reviewed and no changes needed Social history: No alcohol, tobacco, or drugs. Family history: Non-contributory Allergies: No known drug allergies. Medications: Reviewed and no changes needed   Review of Systems    no fevers, chills, night sweats, weight loss Objective:   Physical Exam General: Well-developed, Caucasian female in no acute distress. Skin: Warm and dry. Musculoskeletal exam:  Right Ankle: There is prominence of the medial malleolus, this is tender to palpation. I note no subluxation of the posterior tibialis tendon when the ankle was taken through the range of motion. Range of motion is full in all directions. Strength is 5/5 in all directions. Stable lateral and medial ligaments; squeeze test and kleiger test unremarkable; Talar dome nontender; No pain at base of 5th MT; No tenderness over cuboid; No tenderness over N spot or navicular prominence No tenderness on posterior aspects of lateral and medial malleolus No sign of peroneal tendon subluxations or tenderness to palpation Negative tarsal tunnel tinel's  Musculoskeletal ultrasound: We imaged the medial malleolus, Posterior tibialis tendon, Flexor hallucis longus, and flexor digitorum longus tendons. I did note a cystic area consistent with a ganglion cyst that appeared to be originating from the posterior tibialis tendon. This measured 0.78 x 0.12 cm. It was compressible.  We follow the posterior tibialis tendon to its insertion in the navicular bone and noted no abnormalities. There were also some spurs noted along the medial malleolus. The retinaculum was intact. Images were saved  She did have some prominence of the medial malleolus on inspection, as well as some flattening of the right arch. We provided the patient with scaphoid pads to put in her shoes.    Assessment & Plan:

## 2012-04-20 ENCOUNTER — Telehealth: Payer: Self-pay

## 2012-04-20 MED ORDER — PREDNISONE (PAK) 10 MG PO TABS: 10.0000 mg | ORAL_TABLET | ORAL | Status: DC

## 2012-04-20 NOTE — Telephone Encounter (Signed)
Notified pt rx sent to pharmacy... 04/20/12@5 :20pm/LMB

## 2012-04-20 NOTE — Telephone Encounter (Signed)
pred pk - ex done Use calamine lotion OTC as needed

## 2012-04-20 NOTE — Telephone Encounter (Signed)
Pt called requesting Rx for poison ivy rash on all extremities and neck. Last OV 12/201, please advise.

## 2012-05-02 ENCOUNTER — Encounter: Payer: Self-pay | Admitting: Internal Medicine

## 2012-05-02 ENCOUNTER — Ambulatory Visit (INDEPENDENT_AMBULATORY_CARE_PROVIDER_SITE_OTHER): Payer: Managed Care, Other (non HMO) | Admitting: Internal Medicine

## 2012-05-02 VITALS — BP 110/72 | HR 66 | Temp 97.0°F | Ht 64.5 in | Wt 213.1 lb

## 2012-05-02 DIAGNOSIS — L255 Unspecified contact dermatitis due to plants, except food: Secondary | ICD-10-CM

## 2012-05-02 MED ORDER — METHYLPREDNISOLONE ACETATE 80 MG/ML IJ SUSP
120.0000 mg | Freq: Once | INTRAMUSCULAR | Status: AC
  Administered 2012-05-02: 120 mg via INTRAMUSCULAR

## 2012-05-02 MED ORDER — PREDNISONE (PAK) 10 MG PO TABS: 10.0000 mg | ORAL_TABLET | ORAL | Status: AC

## 2012-05-02 NOTE — Patient Instructions (Signed)
It was good to see you today. IM Medrol today and repeat pred pak Please schedule followup as needed, call sooner if problems.  Contact Dermatitis Contact dermatitis is a reaction to certain substances that touch the skin. Contact dermatitis can be either irritant contact dermatitis or allergic contact dermatitis. Irritant contact dermatitis does not require previous exposure to the substance for a reaction to occur. Allergic contact dermatitis only occurs if you have been exposed to the substance before. Upon a repeat exposure, your body reacts to the substance.   CAUSES   Many substances can cause contact dermatitis. Irritant dermatitis is most commonly caused by repeated exposure to mildly irritating substances, such as:  Makeup.   Soaps.   Detergents.   Bleaches.   Acids.   Metal salts, such as nickel.  Allergic contact dermatitis is most commonly caused by exposure to:  Poisonous plants.   Chemicals (deodorants, shampoos).   Jewelry.   Latex.   Neomycin in triple antibiotic cream.   Preservatives in products, including clothing.  SYMPTOMS   The area of skin that is exposed may develop:  Dryness or flaking.   Redness.   Cracks.   Itching.   Pain or a burning sensation.   Blisters.  With allergic contact dermatitis, there may also be swelling in areas such as the eyelids, mouth, or genitals.   DIAGNOSIS   Your caregiver can usually tell what the problem is by doing a physical exam. In cases where the cause is uncertain and an allergic contact dermatitis is suspected, a patch skin test may be performed to help determine the cause of your dermatitis. TREATMENT Treatment includes protecting the skin from further contact with the irritating substance by avoiding that substance if possible. Barrier creams, powders, and gloves may be helpful. Your caregiver may also recommend:  Steroid creams or ointments applied 2 times daily. For best results, soak the rash area in  cool water for 20 minutes. Then apply the medicine. Cover the area with a plastic wrap. You can store the steroid cream in the refrigerator for a "chilly" effect on your rash. That may decrease itching. Oral steroid medicines may be needed in more severe cases.   Antibiotics or antibacterial ointments if a skin infection is present.   Antihistamine lotion or an antihistamine taken by mouth to ease itching.   Lubricants to keep moisture in your skin.   Burow's solution to reduce redness and soreness or to dry a weeping rash. Mix one packet or tablet of solution in 2 cups cool water. Dip a clean washcloth in the mixture, wring it out a bit, and put it on the affected area. Leave the cloth in place for 30 minutes. Do this as often as possible throughout the day.   Taking several cornstarch or baking soda baths daily if the area is too large to cover with a washcloth.  Harsh chemicals, such as alkalis or acids, can cause skin damage that is like a burn. You should flush your skin for 15 to 20 minutes with cold water after such an exposure. You should also seek immediate medical care after exposure. Bandages (dressings), antibiotics, and pain medicine may be needed for severely irritated skin.   HOME CARE INSTRUCTIONS  Avoid the substance that caused your reaction.   Keep the area of skin that is affected away from hot water, soap, sunlight, chemicals, acidic substances, or anything else that would irritate your skin.   Do not scratch the rash. Scratching may cause the  rash to become infected.   You may take cool baths to help stop the itching.   Only take over-the-counter or prescription medicines as directed by your caregiver.   See your caregiver for follow-up care as directed to make sure your skin is healing properly.  SEEK MEDICAL CARE IF:    Your condition is not better after 3 days of treatment.   You seem to be getting worse.   You see signs of infection such as swelling,  tenderness, redness, soreness, or warmth in the affected area.   You have any problems related to your medicines.  Document Released: 10/02/2000 Document Revised: 09/24/2011 Document Reviewed: 03/10/2011 [facility] Patient Information 2012 [place], [place].

## 2012-05-02 NOTE — Progress Notes (Signed)
  Subjective:    Patient ID: Diane Perkins, female    DOB: 12-28-1955, 56 y.o.   MRN: [id number]  HPI  complains of posion ivy Hx same Improved with pred pak last week but not resolved - increase in itch and redness  Past Medical History  Diagnosis Date  . ANEMIA-NOS   . OBESITY     Review of Systems  Constitutional: Negative for fever and fatigue.       Objective:   Physical Exam BP 110/72  Pulse 66  Temp 97 F (36.1 C) (Oral)  Ht 5' 4.5" (1.638 m)  Wt 213 lb 1.9 oz (96.671 kg)  BMI 36.02 kg/m2  SpO2 95% Wt Readings from Last 3 Encounters:  05/02/12 213 lb 1.9 oz (96.671 kg)  06/03/11 195 lb (88.451 kg)  11/03/10 205 lb 3.2 oz (93.078 kg)   Constitutional: She appears well-developed and well-nourished. No distress. spouse at side Cardiovascular: Normal rate, regular rhythm and normal heart sounds.  No murmur heard. No BLE edema. Pulmonary/Chest: Effort normal and breath sounds normal. No respiratory distress. She has no wheezes.  Skin: rash consistent with poison ivy dermatitis - legs/aras, neck and face - mild edema. No cellulitis.  Psychiatric: She has a normal mood and affect. Her behavior is normal. Judgment and thought content normal.   Lab Results  Component Value Date   WBC 7.4 10/27/2010   HGB 14.2 10/27/2010   HCT 41.4 10/27/2010   PLT 229.0 10/27/2010   GLUCOSE 85 10/27/2010   CHOL 230* 10/27/2010   TRIG 75.0 10/27/2010   HDL 81.70 10/27/2010   LDLDIRECT 141.1 10/27/2010   LDLCALC 120* 09/23/2009   ALT 22 10/27/2010   AST 22 10/27/2010   NA 142 10/27/2010   K 4.8 10/27/2010   CL 106 10/27/2010   CREATININE 0.8 10/27/2010   BUN 17 10/27/2010   CO2 30 10/27/2010   TSH 1.51 10/27/2010        Assessment & Plan:  poison ivy dermatitis - education provided on same IM medrol today, repeat pred pak

## 2012-05-16 ENCOUNTER — Other Ambulatory Visit: Payer: Self-pay | Admitting: Obstetrics and Gynecology

## 2012-05-16 DIAGNOSIS — Z1231 Encounter for screening mammogram for malignant neoplasm of breast: Secondary | ICD-10-CM

## 2012-06-06 ENCOUNTER — Ambulatory Visit: Payer: Managed Care, Other (non HMO)

## 2012-07-11 ENCOUNTER — Ambulatory Visit: Payer: Managed Care, Other (non HMO)

## 2012-07-18 ENCOUNTER — Ambulatory Visit
Admission: RE | Admit: 2012-07-18 | Discharge: 2012-07-18 | Disposition: A | Discharge status: Home / Self Care | Payer: Managed Care, Other (non HMO) | Source: Ambulatory Visit | Attending: Obstetrics and Gynecology | Admitting: Obstetrics and Gynecology

## 2012-07-18 DIAGNOSIS — Z1231 Encounter for screening mammogram for malignant neoplasm of breast: Secondary | ICD-10-CM

## 2013-06-27 ENCOUNTER — Other Ambulatory Visit: Payer: Self-pay

## 2013-06-27 DIAGNOSIS — Z1231 Encounter for screening mammogram for malignant neoplasm of breast: Secondary | ICD-10-CM

## 2013-07-24 ENCOUNTER — Ambulatory Visit
Admission: RE | Admit: 2013-07-24 | Discharge: 2013-07-24 | Disposition: A | Discharge status: Home / Self Care | Payer: No Typology Code available for payment source | Source: Ambulatory Visit

## 2013-07-24 DIAGNOSIS — Z1231 Encounter for screening mammogram for malignant neoplasm of breast: Secondary | ICD-10-CM

## 2014-05-28 ENCOUNTER — Encounter: Payer: Self-pay | Admitting: Gastroenterology

## 2014-07-02 ENCOUNTER — Other Ambulatory Visit: Payer: Self-pay

## 2014-07-02 DIAGNOSIS — Z1231 Encounter for screening mammogram for malignant neoplasm of breast: Secondary | ICD-10-CM

## 2014-07-30 ENCOUNTER — Ambulatory Visit
Admission: RE | Admit: 2014-07-30 | Discharge: 2014-07-30 | Disposition: A | Discharge status: Home / Self Care | Payer: PRIVATE HEALTH INSURANCE | Source: Ambulatory Visit

## 2014-07-30 DIAGNOSIS — Z1231 Encounter for screening mammogram for malignant neoplasm of breast: Secondary | ICD-10-CM

## 2014-08-20 ENCOUNTER — Encounter: Payer: Self-pay | Admitting: Family

## 2014-08-20 ENCOUNTER — Other Ambulatory Visit (INDEPENDENT_AMBULATORY_CARE_PROVIDER_SITE_OTHER): Payer: PRIVATE HEALTH INSURANCE

## 2014-08-20 ENCOUNTER — Ambulatory Visit (INDEPENDENT_AMBULATORY_CARE_PROVIDER_SITE_OTHER): Payer: PRIVATE HEALTH INSURANCE | Admitting: Family

## 2014-08-20 VITALS — BP 120/84 | HR 62 | Temp 97.7°F | Resp 18 | Ht 65.0 in | Wt 232.1 lb

## 2014-08-20 DIAGNOSIS — L659 Nonscarring hair loss, unspecified: Secondary | ICD-10-CM | POA: Insufficient documentation

## 2014-08-20 DIAGNOSIS — R5383 Other fatigue: Secondary | ICD-10-CM

## 2014-08-20 LAB — CBC
HCT: 42.7 % (ref 36.0–46.0)
Hemoglobin: 13.8 g/dL (ref 12.0–15.0)
MCHC: 32.3 g/dL (ref 30.0–36.0)
MCV: 85 fl (ref 78.0–100.0)
Platelets: 207 10*3/uL (ref 150.0–400.0)
RBC: 5.02 Mil/uL (ref 3.87–5.11)
RDW: 14.3 % (ref 11.5–15.5)
WBC: 7.4 10*3/uL (ref 4.0–10.5)

## 2014-08-20 LAB — BASIC METABOLIC PANEL
BUN: 10 mg/dL (ref 6–23)
CO2: 23 mEq/L (ref 19–32)
Calcium: 9.4 mg/dL (ref 8.4–10.5)
Chloride: 105 mEq/L (ref 96–112)
Creatinine, Ser: 0.8 mg/dL (ref 0.4–1.2)
GFR: 76.59 mL/min (ref >60.00)
Glucose, Bld: 88 mg/dL (ref 70–99)
Potassium: 4.6 mEq/L (ref 3.5–5.1)
Sodium: 140 mEq/L (ref 135–145)

## 2014-08-20 NOTE — Progress Notes (Signed)
   Subjective:    Patient ID: Diane Perkins, female    DOB: [birth year], 58 y.o.   MRN: [id number]  Chief Complaint  Patient presents with  . Follow-up    Hair loss and fatigue x1 month, would like thyroid panel as well and A1c if possible    HPI:  Diane Perkins is a 58 y.o. female who presents today for fatigue.   1) Fatigue - Has been tired lately. Denies shortness of breath. Denies any change in stressors or mood. Describes how she is constantly drinking. Denies excessive urination or hunger. Has been noticed to bruise easily.   2) Hair Loss - More than half of her hair on top has fallen out starting about 3 weeks ago. Has not changed any soaps, detergents or shampoos. Denies any changes to skin or nails. Has to keep her hair well-conditioned otherwise it is more dry and brittle.   No Known Allergies  No current outpatient prescriptions on file prior to visit.   No current facility-administered medications on file prior to visit.   Review of Systems    See HPI Objective:    BP 120/84 mmHg  Pulse 62  Temp(Src) 97.7 F (36.5 C) (Oral)  Resp 18  Ht 5\' 5"  (1.651 m)  Wt 232 lb 1.9 oz (105.289 kg)  BMI 38.63 kg/m2  SpO2 96% Nursing note and vital signs reviewed.  Physical Exam  Constitutional: She is oriented to person, place, and time. She appears well-developed and well-nourished. No distress.  HENT:  Head: Hair is abnormal (Hair is soft and appears thin on top of head.).  Cardiovascular: Normal rate, regular rhythm, normal heart sounds and intact distal pulses.   Pulmonary/Chest: Effort normal and breath sounds normal.  Neurological: She is alert and oriented to person, place, and time.  Skin: Skin is warm and dry. No cyanosis. Nails show no clubbing.  Nails appear with no abnormalities.   Psychiatric: She has a normal mood and affect. Her behavior is normal. Judgment and thought content normal.       Assessment & Plan:

## 2014-08-20 NOTE — Assessment & Plan Note (Signed)
Recent onset of fatigue. Will check CBC, TSH and BMET. Information sheet on generalized fatigue provided to patient. Follow up pending lab work results.

## 2014-08-20 NOTE — Patient Instructions (Signed)
Thank you for choosing [facility].  Summary/Instructions:   Please stop by the lab prior to leaving  We will be in touch with the results when they become available.  Follow up dependent upon results.    Fatigue Fatigue is a feeling of tiredness, lack of energy, lack of motivation, or feeling tired all the time. Having enough rest, good nutrition, and reducing stress will normally reduce fatigue. Consult your caregiver if it persists. The nature of your fatigue will help your caregiver to find out its cause. The treatment is based on the cause.  CAUSES  There are many causes for fatigue. Most of the time, fatigue can be traced to one or more of your habits or routines. Most causes fit into one or more of three general areas. They are: Lifestyle problems  Sleep disturbances.  Overwork.  Physical exertion.  Unhealthy habits.  Poor eating habits or eating disorders.  Alcohol and/or drug use .  Lack of proper nutrition (malnutrition). Psychological problems  Stress and/or anxiety problems.  Depression.  Grief.  Boredom. Medical Problems or Conditions  Anemia.  Pregnancy.  Thyroid gland problems.  Recovery from major surgery.  Continuous pain.  Emphysema or asthma that is not well controlled  Allergic conditions.  Diabetes.  Infections (such as mononucleosis).  Obesity.  Sleep disorders, such as sleep apnea.  Heart failure or other heart-related problems.  Cancer.  Kidney disease.  Liver disease.  Effects of certain medicines such as antihistamines, cough and cold remedies, prescription pain medicines, heart and blood pressure medicines, drugs used for treatment of cancer, and some antidepressants. SYMPTOMS  The symptoms of fatigue include:   Lack of energy.  Lack of drive (motivation).  Drowsiness.  Feeling of indifference to the surroundings. DIAGNOSIS  The details of how you feel help guide your caregiver in finding out what  is causing the fatigue. You will be asked about your present and past health condition. It is important to review all medicines that you take, including prescription and non-prescription items. A thorough exam will be done. You will be questioned about your feelings, habits, and normal lifestyle. Your caregiver may suggest blood tests, urine tests, or other tests to look for common medical causes of fatigue.  TREATMENT  Fatigue is treated by correcting the underlying cause. For example, if you have continuous pain or depression, treating these causes will improve how you feel. Similarly, adjusting the dose of certain medicines will help in reducing fatigue.  HOME CARE INSTRUCTIONS   Try to get the required amount of good sleep every night.  Eat a healthy and nutritious diet, and drink enough water throughout the day.  Practice ways of relaxing (including yoga or meditation).  Exercise regularly.  Make plans to change situations that cause stress. Act on those plans so that stresses decrease over time. Keep your work and personal routine reasonable.  Avoid street drugs and minimize use of alcohol.  Start taking a daily multivitamin after consulting your caregiver. SEEK MEDICAL CARE IF:   You have persistent tiredness, which cannot be accounted for.  You have fever.  You have unintentional weight loss.  You have headaches.  You have disturbed sleep throughout the night.  You are feeling sad.  You have constipation.  You have dry skin.  You have gained weight.  You are taking any new or different medicines that you suspect are causing fatigue.  You are unable to sleep at night.  You develop any unusual swelling of your legs or  other parts of your body. SEEK IMMEDIATE MEDICAL CARE IF:   You are feeling confused.  Your vision is blurred.  You feel faint or pass out.  You develop severe headache.  You develop severe abdominal, pelvic, or back pain.  You develop chest  pain, shortness of breath, or an irregular or fast heartbeat.  You are unable to pass a normal amount of urine.  You develop abnormal bleeding such as bleeding from the rectum or you vomit blood.  You have thoughts about harming yourself or committing suicide.  You are worried that you might harm someone else. MAKE SURE YOU:   Understand these instructions.  Will watch your condition.  Will get help right away if you are not doing well or get worse. Document Released: 08/02/2007 Document Revised: 12/28/2011 Document Reviewed: 02/06/2014 [facility] Patient Information 2015 [place] [place]. This information is not intended to replace advice given to you by your health care provider. Make sure you discuss any questions you have with your health care provider.

## 2014-08-20 NOTE — Assessment & Plan Note (Signed)
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 Will check TSH and testosterone levels.

## 2014-08-20 NOTE — Progress Notes (Signed)
Pre visit review using our clinic review tool, if applicable. No additional management support is needed unless otherwise documented below in the visit note. 

## 2014-08-21 ENCOUNTER — Encounter: Payer: Self-pay | Admitting: Family

## 2014-08-21 LAB — TESTOSTERONE, FREE, TOTAL, SHBG
Sex Hormone Binding: 46 nmol/L (ref 18–114)
Testosterone, Free: 5.5 pg/mL (ref 0.6–6.8)
Testosterone-% Free: 1.5 % (ref 0.4–2.4)
Testosterone: 38 ng/dL (ref 10–70)

## 2014-08-21 LAB — TSH: TSH: 2.37 u[IU]/mL (ref 0.35–4.50)

## 2014-08-21 LAB — TESTOSTERONE: Testosterone: 31.28 ng/dL (ref 15.00–40.00)

## 2014-11-30 ENCOUNTER — Telehealth: Payer: Self-pay

## 2014-11-30 NOTE — Telephone Encounter (Signed)
LVM for pt to call back.   RE: Flu vaccine for 2015/2016 

## 2015-04-30 ENCOUNTER — Other Ambulatory Visit: Payer: Self-pay

## 2015-04-30 DIAGNOSIS — Z1231 Encounter for screening mammogram for malignant neoplasm of breast: Secondary | ICD-10-CM

## 2015-08-05 ENCOUNTER — Ambulatory Visit
Admission: RE | Admit: 2015-08-05 | Discharge: 2015-08-05 | Disposition: A | Discharge status: Home / Self Care | Payer: PRIVATE HEALTH INSURANCE | Source: Ambulatory Visit

## 2015-08-05 DIAGNOSIS — Z1231 Encounter for screening mammogram for malignant neoplasm of breast: Secondary | ICD-10-CM

## 2019-07-04 ENCOUNTER — Encounter (INDEPENDENT_AMBULATORY_CARE_PROVIDER_SITE_OTHER): Payer: BC Managed Care – PPO | Admitting: Ophthalmology

## 2019-07-04 ENCOUNTER — Other Ambulatory Visit: Payer: Self-pay

## 2019-07-04 DIAGNOSIS — H2513 Age-related nuclear cataract, bilateral: Secondary | ICD-10-CM

## 2019-07-04 DIAGNOSIS — H43813 Vitreous degeneration, bilateral: Secondary | ICD-10-CM

## 2019-07-04 DIAGNOSIS — H353132 Nonexudative age-related macular degeneration, bilateral, intermediate dry stage: Secondary | ICD-10-CM | POA: Diagnosis not present

## 2020-01-01 ENCOUNTER — Encounter (INDEPENDENT_AMBULATORY_CARE_PROVIDER_SITE_OTHER): Payer: BC Managed Care – PPO | Admitting: Ophthalmology

## 2020-04-26 ENCOUNTER — Other Ambulatory Visit: Payer: Self-pay | Admitting: Gynecology

## 2020-04-26 DIAGNOSIS — Z1231 Encounter for screening mammogram for malignant neoplasm of breast: Secondary | ICD-10-CM

## 2020-05-28 ENCOUNTER — Other Ambulatory Visit: Payer: Self-pay

## 2020-05-28 ENCOUNTER — Ambulatory Visit
Admission: RE | Admit: 2020-05-28 | Discharge: 2020-05-28 | Disposition: A | Discharge status: Home / Self Care | Payer: BC Managed Care – PPO | Source: Ambulatory Visit | Attending: Gynecology | Admitting: Gynecology

## 2020-05-28 DIAGNOSIS — Z1231 Encounter for screening mammogram for malignant neoplasm of breast: Secondary | ICD-10-CM

## 2020-05-28 HISTORY — DX: Diffuse cystic mastopathy of unspecified breast: N60.19

## 2020-11-18 ENCOUNTER — Other Ambulatory Visit: Payer: Self-pay

## 2020-11-18 ENCOUNTER — Emergency Department (HOSPITAL_COMMUNITY): Payer: BC Managed Care – PPO

## 2020-11-18 ENCOUNTER — Encounter (HOSPITAL_COMMUNITY): Payer: Self-pay | Admitting: Emergency Medicine

## 2020-11-18 ENCOUNTER — Emergency Department (HOSPITAL_COMMUNITY)
Admission: EM | Admit: 2020-11-18 | Discharge: 2020-11-18 | Disposition: A | Discharge status: Home / Self Care | Payer: BC Managed Care – PPO | Attending: Emergency Medicine | Admitting: Emergency Medicine

## 2020-11-18 ENCOUNTER — Telehealth: Payer: Self-pay

## 2020-11-18 DIAGNOSIS — Z87891 Personal history of nicotine dependence: Secondary | ICD-10-CM | POA: Diagnosis not present

## 2020-11-18 DIAGNOSIS — Z8616 Personal history of COVID-19: Secondary | ICD-10-CM | POA: Diagnosis not present

## 2020-11-18 DIAGNOSIS — I4891 Unspecified atrial fibrillation: Secondary | ICD-10-CM | POA: Insufficient documentation

## 2020-11-18 DIAGNOSIS — R0789 Other chest pain: Secondary | ICD-10-CM | POA: Insufficient documentation

## 2020-11-18 DIAGNOSIS — Z7901 Long term (current) use of anticoagulants: Secondary | ICD-10-CM | POA: Diagnosis not present

## 2020-11-18 DIAGNOSIS — M549 Dorsalgia, unspecified: Secondary | ICD-10-CM | POA: Insufficient documentation

## 2020-11-18 HISTORY — DX: Unspecified atrial fibrillation: I48.91

## 2020-11-18 LAB — CBC
HCT: 42.5 % (ref 36.0–46.0)
Hemoglobin: 13.7 g/dL (ref 12.0–15.0)
MCH: 28.7 pg (ref 26.0–34.0)
MCHC: 32.2 g/dL (ref 30.0–36.0)
MCV: 88.9 fL (ref 80.0–100.0)
Platelets: 206 10*3/uL (ref 150–400)
RBC: 4.78 MIL/uL (ref 3.87–5.11)
RDW: 13.7 % (ref 11.5–15.5)
WBC: 7.5 10*3/uL (ref 4.0–10.5)
nRBC: 0 % (ref 0.0–0.2)

## 2020-11-18 LAB — BASIC METABOLIC PANEL
Anion gap: 10 (ref 5–15)
BUN: 13 mg/dL (ref 8–23)
CO2: 23 mmol/L (ref 22–32)
Calcium: 9.4 mg/dL (ref 8.9–10.3)
Chloride: 106 mmol/L (ref 98–111)
Creatinine, Ser: 0.72 mg/dL (ref 0.44–1.00)
GFR, Estimated: >60 mL/min (ref >60)
Glucose, Bld: 103 mg/dL — ABNORMAL HIGH (ref 70–99)
Potassium: 4.2 mmol/L (ref 3.5–5.1)
Sodium: 139 mmol/L (ref 135–145)

## 2020-11-18 LAB — TROPONIN I (HIGH SENSITIVITY)
Troponin I (High Sensitivity): 2 ng/L (ref <18)
Troponin I (High Sensitivity): 3 ng/L (ref <18)

## 2020-11-18 MED ORDER — CYCLOBENZAPRINE HCL 10 MG PO TABS: 10.0000 mg | ORAL_TABLET | Freq: Two times a day (BID) | ORAL | 0 refills | Status: DC | PRN

## 2020-11-18 NOTE — Discharge Instructions (Addendum)
Please follow closely with your primary care. Continue treating your symptoms with over-the-counter medications as needed. Drink plenty of water. You can take flexeril every 12 hours as needed for muscle spasm. Return to the ED if you develop recurrent persisting chest pain, pain that is worse with breathing, chest pain with exertion with associated nausea/sweating/shortness of breath, or for any other concerning symptoms.

## 2020-11-18 NOTE — ED Triage Notes (Signed)
Pt states she woke up with right  Upper chest pain yesterday. Pt recently had covid on the 11th. Pt states since then she has been feeling better. Pt went to urgent care and had an xray that showed pneumonia. Pt has hx of afib.

## 2020-11-18 NOTE — ED Notes (Signed)
Patient verbalizes understanding of discharge instructions. Opportunity for questioning and answers were provided. Armband removed by staff, pt discharged from ED ambulatory.   

## 2020-11-18 NOTE — Telephone Encounter (Signed)
NOTES ON FILE FROM [facility] [phone], SENT REFERRAL TO SCHEDULING

## 2020-11-18 NOTE — ED Provider Notes (Signed)
[facility] EMERGENCY DEPARTMENT Provider Note   CSN: [id number] Arrival date & time: 11/18/20  1128     History Chief Complaint  Patient presents with  . Chest Pain    Diane Perkins is a 65 y.o. female with past medical history of atrial fibrillation on Eliquis, recent COVID-19 illness, presenting to the emergency department for evaluation of chest pain.  Patient states she woke up yesterday morning and noticed a pain in the right side of her chest.  She states it felt like a pulled muscle.  It was worse with movement of her neck and arm in certain directions.  It radiated to her back, which she also felt pain in her back with movement of her neck and arm.  She has not felt any shortness of breath, diaphoresis or nausea with the pain. It is not pleuritic.  It resolved with Tylenol.  She has minimal soreness today.  She states throughout the day today she has been having some indigestion feeling that feels similar to when she is in and out of atrial fibrillation, though her smart watch shows normal heart rates, no rapid rates.  She has not missed any doses of her Eliquis.  No new unilateral leg pain or swelling, no history of DVT or PE.  She went to an urgent care yesterday who did a chest x-ray which showed findings suspicious for a pneumonia.  They instructed her to come to the ED for evaluation of possible blood clot.  She states she was sure she did not have a blood clot therefore did not come to the ED until today for a second opinion. She states her symptoms of COVID have nearly resolved. She has had some ongoing mild SOB with exertion since her COVID illness though that has not worsened in any way, she did not notice SOB while waiting in the waiting room today. She was diagnoses with COVID on Jan 11.    No history of CAD.  The history is provided by the patient.       Past Medical History:  Diagnosis Date  . A-fib (HCC)   . ANEMIA-NOS   . Fibrocystic breast     Right Breast-hx of cysts-benign  . OBESITY     Patient Active Problem List   Diagnosis Date Noted  . Hair loss 08/20/2014  . Fatigue 08/20/2014  . Ganglion cyst of the right posterior tibialis tendon 06/03/2011  . OBESITY 09/30/2009  . ANEMIA-NOS 09/30/2009  . EMPHYSEMA 09/30/2009  . CHICKENPOX, HX OF 09/30/2009    Past Surgical History:  Procedure Laterality Date  . APPENDECTOMY  1981  . BREAST SURGERY     breast biopsy  . CHOLECYSTECTOMY  2001  . TONSILLECTOMY  1972     OB History   No obstetric history on file.     Family History  Problem Relation Age of Onset  . Diabetes Father   . Arthritis Other        grandparent, parent  . Breast cancer Other        other relative  . Colon cancer Other        parent  . Hyperlipidemia Other   . Heart disease Other     Social History   Tobacco Use  . Smoking status: Former Smoker    Types: Cigarettes    Quit date: 10/20/1999    Years since quitting: 21.0  . Smokeless tobacco: Never Used  . Tobacco comment: Married, lives with spouse (who is  s/p tx rectal cancer-[place])  Substance Use Topics  . Alcohol use: Yes    Comment: occassional  . Drug use: No    Home Medications Prior to Admission medications   Medication Sig Start Date End Date Taking? Authorizing Provider  ascorbic acid (VITAMIN C) 500 MG tablet Take 500-1,000 mg by mouth daily.   Yes [provider]  Biotin 10 MG CAPS Take 10 mg by mouth daily with breakfast.   Yes [provider]  Cholecalciferol (VITAMIN D3) 50 MCG (2000 UT) TABS Take 2,000 Units by mouth daily.   Yes [provider]  cyclobenzaprine (FLEXERIL) 10 MG tablet Take 1 tablet (10 mg total) by mouth 2 (two) times daily as needed for muscle spasms. 11/18/20  Yes Roxan Hockey, [place] N, PA-C  ELIQUIS 5 MG TABS tablet Take 5 mg by mouth 2 (two) times daily. 11/04/20  Yes [provider]  loratadine (CLARITIN) 10 MG tablet Take 10 mg by mouth daily.   Yes [provider]  Magnesium 500 MG TABS Take 500 mg by mouth daily after breakfast.   Yes [provider]  Melatonin 10 MG TABS Take 10 mg by mouth at bedtime.   Yes [provider]  metoprolol tartrate (LOPRESSOR) 25 MG tablet Take 25 mg by mouth 2 (two) times daily. 11/04/20  Yes [provider]  Multiple Vitamins-Minerals (ICAPS AREDS FORMULA PO) Take 1 capsule by mouth daily with breakfast.   Yes [provider]  Multiple Vitamins-Minerals (ONE-A-DAY WOMENS 50 PLUS PO) Take 1 tablet by mouth daily with breakfast.   Yes [provider]  rosuvastatin (CRESTOR) 10 MG tablet Take 10 mg by mouth every evening.   Yes [provider]  zinc gluconate 50 MG tablet Take 50 mg by mouth daily with breakfast.   Yes [provider]    Allergies    Patient has no known allergies.  Review of Systems   Review of Systems  Cardiovascular: Positive for chest pain.  All other systems reviewed and are negative.   Physical Exam Updated Vital Signs BP 130/82   Pulse 60   Temp (!) 97.5 F (36.4 C)   Resp 13   Ht 5\' 5"  (1.651 m)   Wt 102.1 kg   SpO2 97%   BMI 37.44 kg/m   Physical Exam Vitals and nursing note reviewed.  Constitutional:      General: She is not in acute distress.    Appearance: She is well-developed and well-nourished. She is not ill-appearing.  HENT:     Head: Normocephalic and atraumatic.  Eyes:     Conjunctiva/sclera: Conjunctivae normal.  Cardiovascular:     Rate and Rhythm: Normal rate and regular rhythm.     Comments: Radial pulses equal b/l Pulmonary:     Effort: Pulmonary effort is normal. No respiratory distress.     Breath sounds: Normal breath sounds.  Chest:     Chest wall: No tenderness.  Abdominal:     Palpations: Abdomen is soft.  Musculoskeletal:        General: No tenderness (no calf TTP).     Right lower leg: No edema.     Left lower leg: No edema.  Skin:    General: Skin is warm.   Neurological:     Mental Status: She is alert.  Psychiatric:        Mood and Affect: Mood and affect normal.        Behavior: Behavior normal.     ED  Results / Procedures / Treatments   Labs (all labs ordered are listed, but only abnormal results are displayed) Labs Reviewed  BASIC METABOLIC PANEL - Abnormal; Notable for the following components:      Result Value   Glucose, Bld 103 (*)    All other components within normal limits  CBC  TROPONIN I (HIGH SENSITIVITY)  TROPONIN I (HIGH SENSITIVITY)    EKG EKG Interpretation  Date/Time:  Monday November 18 2020 11:59:13 EST Ventricular Rate:  63 PR Interval:  158 QRS Duration: 74 QT Interval:  410 QTC Calculation: 419 R Axis:   35 Text Interpretation: Normal sinus rhythm Low voltage QRS Cannot rule out Anterior infarct , age undetermined Abnormal ECG NO STEMI Confirmed by Alvester Chou [phone]) on 11/18/2020 10:55:50 PM   Radiology DG Chest 2 View  Result Date: 11/18/2020 CLINICAL DATA:  Chest pain EXAM: CHEST - 2 VIEW COMPARISON:  08/16/2006 FINDINGS: The heart size and mediastinal contours are within normal limits. Both lungs are clear. The visualized skeletal structures are unremarkable. IMPRESSION: No active cardiopulmonary disease. Electronically Signed   By: Duanne Guess D.O.   On: 11/18/2020 12:37    Procedures Procedures   Medications Ordered in ED Medications - No data to display  ED Course  I have reviewed the triage vital signs and the nursing notes.  Pertinent labs & imaging results that were available during my care of the patient were reviewed by me and considered in my medical decision making (see chart for details).    MDM Rules/Calculators/A&P                          Patient presenting for evaluation after an episode of chest pain that she noticed upon awakening yesterday morning. States it felt like a pulled muscle, as if she slept wrong.  Her symptoms were made worse with particular  movements of her arm and turning her head, felt like it was pulling a muscle and causing the sharp pain.  She also felt the same soreness in her back.  She treated with Tylenol and symptoms are nearly resolved.  She went to urgent care yesterday who did a chest x-ray which showed findings concerning for pneumonia and they recommended she come to the ED for evaluation of possible blood clot.  She however is compliant with her Eliquis, has not missed any doses of this.    She has no secondary findings concerning for DVT.  Her vital signs are stable, she is not tachycardic or tachypneic, she is not hypoxic.  She is very well-appearing on exam.  Her work-up today is reassuring, normal and flat troponins, EKG is nonischemic, chest x-ray is clear.  Basic labs are unremarkable. D-dimer not ordered as will likely be elevated with recent COVID 19 illness.   Presentation does not seem consistent with ACS, low hear score of 2.  Considering PE, had shared decision making with patient.  Patient is compliant on her Eliquis, her symptoms are nearly resolved, they were reproducible with particular movements of her arm and neck which is more consistent with a chest wall pain.  She is not showing any other indications of PE with normal VS, normal troponins, no signs of DVT. Low risk wells. Seems unlikely PE at this time. Patient feels comfortable with discharge to home with PCP follow-up.  She is instructed to continue taking her Eliquis as prescribed.  She seems reliable for follow-up and to return if symptoms change or  worsen in any way.  Patient is discharged in no acute distress.  Discussed results, findings, treatment and follow up. Patient advised of return precautions. Patient verbalized understanding and agreed with plan.  Final Clinical Impression(s) / ED Diagnoses Final diagnoses:  Chest wall pain    Rx / DC Orders ED Discharge Orders         Ordered    cyclobenzaprine (FLEXERIL) 10 MG tablet  2 times daily  PRN        11/18/20 2246           Estoria Geary, [place] N, PA-C 11/18/20 2256    Terald Sleeper, MD 11/19/20 0100

## 2020-11-20 ENCOUNTER — Telehealth: Payer: Self-pay

## 2020-11-20 NOTE — Telephone Encounter (Signed)
NOTES ON [facility] [phone], SENT REFERRAL TO SCHEDULING

## 2020-11-29 ENCOUNTER — Telehealth: Payer: Self-pay

## 2020-11-29 NOTE — Telephone Encounter (Signed)
NOTES ON FILE FROM  [facility] [phone], SENT REFERRAL TO SCHEDULING

## 2020-12-10 NOTE — Progress Notes (Deleted)
CARDIOLOGY CONSULT NOTE       Patient ID: Diane Perkins MRN: [id number] DOB/AGE: 1955-10-28 65 y.o.  Admit date: (Not on file) Referring Physician: Miliam Primary Physician: Quentin Cornwall, MD Primary Cardiologist: New Reason for Consultation: Afib  Active Problems:   * No active hospital problems. *   HPI:  65 y.o. referred by Dr Charma Igo for afib. History of HLD, Anemia and GERD. Afib diagnosed in 2021 And patient started on lopressor and eliquis Seen by Cardiology in [place]. Not clear what W/u done or there was any attempt at conversion   Lab review K 4.6 Cr 0.75 LDL 83 Hct 40 PLT 189  Seen in  11/18/20 with chest pain Pain atypical right sided awoke from sleep with it Had COVID Earlier in month 10/29/20  Felt like she pulled a muscle worse with neck/arm movement Pain improved with Tylenol Her smart watch indicated she may have been in/out of afib. CXR 11/18/20 NAD ECG 11/20/20 SR rate 62 normal  As was ECG done 11/18/20   ***  ROS All other systems reviewed and negative except as noted above  Past Medical History:  Diagnosis Date  . A-fib ([place])   . ANEMIA-NOS   . Degenerative arthritis   . Fatigue   . Fibrocystic breast    Right Breast-hx of cysts-benign  . GERD (gastroesophageal reflux disease)   . Hyperlipidemia   . OBESITY   . Status post right knee replacement     Family History  Problem Relation Age of Onset  . Diabetes Father   . Arthritis Other        grandparent, parent  . Breast cancer Other        other relative  . Colon cancer Other        parent  . Hyperlipidemia Other   . Heart disease Other     Social History   Socioeconomic History  . Marital status: Married    Spouse name: Not on file  . Number of children: Not on file  . Years of education: Not on file  . Highest education level: Not on file  Occupational History  . Not on file  Tobacco Use  . Smoking status: Former Smoker    Types: Cigarettes    Quit date:  10/20/1999    Years since quitting: 21.1  . Smokeless tobacco: Never Used  . Tobacco comment: Married, lives with spouse (who is s/p tx rectal cancer-[place])  Substance and Sexual Activity  . Alcohol use: Yes    Comment: occassional  . Drug use: No  . Sexual activity: Not on file  Other Topics Concern  . Not on file  Social History Narrative  . Not on file   Social Determinants of Health   Financial Resource Strain: Not on file  Food Insecurity: Not on file  Transportation Needs: Not on file  Physical Activity: Not on file  Stress: Not on file  Social Connections: Not on file  Intimate Partner Violence: Not on file    Past Surgical History:  Procedure Laterality Date  . APPENDECTOMY  1981  . BREAST SURGERY     breast biopsy  . CHOLECYSTECTOMY  2001  . HAND SURGERY     right ganglion cyst and below right scapula removed  . KNEE SURGERY     orthoscopic clean out  . lower back fusion    . SHOULDER SURGERY    . TONSILLECTOMY  1972      Current Outpatient  Medications:  .  ascorbic acid (VITAMIN C) 500 MG tablet, Take 500-1,000 mg by mouth daily., Disp: , Rfl:  .  Biotin 10 MG CAPS, Take 10 mg by mouth daily with breakfast., Disp: , Rfl:  .  Cholecalciferol (VITAMIN D3) 50 MCG (2000 UT) TABS, Take 2,000 Units by mouth daily., Disp: , Rfl:  .  cyclobenzaprine (FLEXERIL) 10 MG tablet, Take 1 tablet (10 mg total) by mouth 2 (two) times daily as needed for muscle spasms., Disp: 20 tablet, Rfl: 0 .  ELIQUIS 5 MG TABS tablet, Take 5 mg by mouth 2 (two) times daily., Disp: , Rfl:  .  loratadine (CLARITIN) 10 MG tablet, Take 10 mg by mouth daily., Disp: , Rfl:  .  Magnesium 500 MG TABS, Take 500 mg by mouth daily after breakfast., Disp: , Rfl:  .  Melatonin 10 MG TABS, Take 10 mg by mouth at bedtime., Disp: , Rfl:  .  metoprolol tartrate (LOPRESSOR) 25 MG tablet, Take 25 mg by mouth 2 (two) times daily., Disp: , Rfl:  .  Multiple Vitamins-Minerals (ICAPS AREDS FORMULA PO), Take 1  capsule by mouth daily with breakfast., Disp: , Rfl:  .  Multiple Vitamins-Minerals (ONE-A-DAY WOMENS 50 PLUS PO), Take 1 tablet by mouth daily with breakfast., Disp: , Rfl:  .  rosuvastatin (CRESTOR) 10 MG tablet, Take 10 mg by mouth every evening., Disp: , Rfl:  .  zinc gluconate 50 MG tablet, Take 50 mg by mouth daily with breakfast., Disp: , Rfl:     Physical Exam: There were no vitals taken for this visit.   Affect appropriate Healthy:  appears stated age 30: normal Neck supple with no adenopathy JVP normal no bruits no thyromegaly Lungs clear with no wheezing and good diaphragmatic motion Heart:  S1/S2 no murmur, no rub, gallop or click PMI normal Abdomen: benighn, BS positve, no tenderness, no AAA no bruit.  No HSM or HJR Distal pulses intact with no bruits No edema Neuro non-focal Skin warm and dry Post right partial knee replacement   Labs:   Lab Results  Component Value Date   WBC 7.5 11/18/2020   HGB 13.7 11/18/2020   HCT 42.5 11/18/2020   MCV 88.9 11/18/2020   PLT 206 11/18/2020   No results for input(s): NA, K, CL, CO2, BUN, CREATININE, CALCIUM, PROT, BILITOT, ALKPHOS, ALT, AST, GLUCOSE in the last 168 hours.  Invalid input(s): LABALBU No results found for: CKTOTAL, CKMB, CKMBINDEX, TROPONINI  Lab Results  Component Value Date   CHOL 230 (H) 10/27/2010   CHOL 192 09/23/2009   Lab Results  Component Value Date   HDL 81.70 10/27/2010   HDL 54.70 09/23/2009   Lab Results  Component Value Date   LDLCALC 120 (H) 09/23/2009   Lab Results  Component Value Date   TRIG 75.0 10/27/2010   TRIG 86.0 09/23/2009   Lab Results  Component Value Date   CHOLHDL 3 10/27/2010   CHOLHDL 4 09/23/2009   Lab Results  Component Value Date   LDLDIRECT 141.1 10/27/2010      Radiology: DG Chest 2 View  Result Date: 11/18/2020 CLINICAL DATA:  Chest pain EXAM: CHEST - 2 VIEW COMPARISON:  08/16/2006 FINDINGS: The heart size and mediastinal contours are within  normal limits. Both lungs are clear. The visualized skeletal structures are unremarkable. IMPRESSION: No active cardiopulmonary disease. Electronically Signed   By: Davina Poke D.O.   On: 11/18/2020 12:37    EKG: ***   ASSESSMENT AND PLAN:  1. AFib:  *** 2. HLD:  Continue statin  3. Anticoagulation:  Chronic on eliquis CBC/MET have been normal  4. Chest Pain:  *** ***  Signed: Jenkins Rouge 12/10/2020, 5:10 PM

## 2020-12-16 ENCOUNTER — Ambulatory Visit: Payer: BC Managed Care – PPO | Admitting: Cardiovascular Disease

## 2021-01-13 ENCOUNTER — Other Ambulatory Visit: Payer: Self-pay | Admitting: Physician Assistant

## 2021-01-13 ENCOUNTER — Other Ambulatory Visit: Payer: Self-pay | Admitting: Orthopedic Surgery

## 2021-01-13 DIAGNOSIS — M25551 Pain in right hip: Secondary | ICD-10-CM

## 2021-02-02 ENCOUNTER — Other Ambulatory Visit: Payer: BC Managed Care – PPO

## 2021-02-04 ENCOUNTER — Ambulatory Visit
Admission: RE | Admit: 2021-02-04 | Discharge: 2021-02-04 | Disposition: A | Discharge status: Home / Self Care | Payer: BC Managed Care – PPO | Source: Ambulatory Visit | Attending: Orthopedic Surgery | Admitting: Orthopedic Surgery

## 2021-02-04 DIAGNOSIS — M25551 Pain in right hip: Secondary | ICD-10-CM

## 2021-02-10 ENCOUNTER — Other Ambulatory Visit: Payer: Self-pay | Admitting: Orthopedic Surgery

## 2021-02-10 DIAGNOSIS — M545 Low back pain, unspecified: Secondary | ICD-10-CM

## 2021-02-25 ENCOUNTER — Other Ambulatory Visit: Payer: BC Managed Care – PPO

## 2021-03-21 ENCOUNTER — Other Ambulatory Visit: Payer: Self-pay

## 2021-03-21 ENCOUNTER — Ambulatory Visit
Admission: RE | Admit: 2021-03-21 | Discharge: 2021-03-21 | Disposition: A | Discharge status: Home / Self Care | Payer: BC Managed Care – PPO | Source: Ambulatory Visit | Attending: Orthopedic Surgery | Admitting: Orthopedic Surgery

## 2021-03-21 DIAGNOSIS — M545 Low back pain, unspecified: Secondary | ICD-10-CM

## 2021-03-21 MED ORDER — GADOBENATE DIMEGLUMINE 529 MG/ML IV SOLN
20.0000 mL | Freq: Once | INTRAVENOUS | Status: AC | PRN
  Administered 2021-03-21: 20 mL via INTRAVENOUS

## 2021-04-30 ENCOUNTER — Other Ambulatory Visit: Payer: Self-pay | Admitting: Gynecology

## 2021-04-30 DIAGNOSIS — Z1231 Encounter for screening mammogram for malignant neoplasm of breast: Secondary | ICD-10-CM

## 2021-05-26 ENCOUNTER — Encounter: Payer: Self-pay | Admitting: Physician Assistant

## 2021-06-11 ENCOUNTER — Other Ambulatory Visit: Payer: Self-pay

## 2021-06-11 ENCOUNTER — Ambulatory Visit: Payer: BC Managed Care – PPO | Admitting: Internal Medicine

## 2021-06-11 DIAGNOSIS — I480 Paroxysmal atrial fibrillation: Secondary | ICD-10-CM | POA: Diagnosis not present

## 2021-06-11 NOTE — Patient Instructions (Addendum)
Medication Instructions:  Your physician recommends that you continue on your current medications as directed. Please refer to the Current Medication list given to you today.  Labwork: None ordered.  Testing/Procedures: None ordered.  Follow-Up: Your physician wants you to follow-up in: February 2023 with Lewayne Bunting, MD   Any Other Special Instructions Will Be Listed Below (If Applicable).  If you need a refill on your cardiac medications before your next appointment, please call your pharmacy.

## 2021-06-11 NOTE — Progress Notes (Signed)
HPI Diane Perkins is referred today for evaluation of atrial fib. She is a pleasant 65 yo woman from [place] of [place]. She has had atrial fib diagnosed for over a year and has been told that she is out of rhythm about 6% of the time. She has palpitations. She denies chest pain or sob. She is limited in her activity by arthritic complaints and has become sedentary. She has gained 30 lbs in the last year. She denies syncope. She has been placed on Eliquis and a beta blocker.   No Known Allergies   Current Outpatient Medications  Medication Sig Dispense Refill   Biotin 10 MG CAPS Take 10 mg by mouth daily with breakfast.     cyclobenzaprine (FLEXERIL) 10 MG tablet Take 1 tablet (10 mg total) by mouth 2 (two) times daily as needed for muscle spasms. 20 tablet 0   ELIQUIS 5 MG TABS tablet Take 5 mg by mouth 2 (two) times daily.     loratadine (CLARITIN) 10 MG tablet Take 10 mg by mouth daily.     Melatonin 10 MG TABS Take 10 mg by mouth at bedtime.     metoprolol tartrate (LOPRESSOR) 25 MG tablet Take 25 mg by mouth 2 (two) times daily.     Multiple Vitamins-Minerals (ICAPS AREDS FORMULA PO) Take 1 capsule by mouth daily with breakfast.     Multiple Vitamins-Minerals (ONE-A-DAY WOMENS 50 PLUS PO) Take 1 tablet by mouth daily with breakfast.     rosuvastatin (CRESTOR) 10 MG tablet Take 10 mg by mouth every evening.     ascorbic acid (VITAMIN C) 500 MG tablet Take 500-1,000 mg by mouth daily. (Patient not taking: Reported on 06/11/2021)     Cholecalciferol (VITAMIN D3) 50 MCG (2000 UT) TABS Take 2,000 Units by mouth daily. (Patient not taking: Reported on 06/11/2021)     Magnesium 500 MG TABS Take 500 mg by mouth daily after breakfast. (Patient not taking: Reported on 06/11/2021)     zinc gluconate 50 MG tablet Take 50 mg by mouth daily with breakfast. (Patient not taking: Reported on 06/11/2021)     No current facility-administered medications for this visit.     Past Medical History:   Diagnosis Date   A-fib (HCC)    ANEMIA-NOS    Degenerative arthritis    Fatigue    Fibrocystic breast    Right Breast-hx of cysts-benign   GERD (gastroesophageal reflux disease)    Hyperlipidemia    OBESITY    Status post right knee replacement     ROS:   All systems reviewed and negative except as noted in the HPI.   Past Surgical History:  Procedure Laterality Date   APPENDECTOMY  1981   BREAST SURGERY     breast biopsy   CHOLECYSTECTOMY  2001   HAND SURGERY     right ganglion cyst and below right scapula removed   KNEE SURGERY     orthoscopic clean out   lower back fusion     SHOULDER SURGERY     TONSILLECTOMY  1972     Family History  Problem Relation Age of Onset   Diabetes Father    Arthritis Other        grandparent, parent   Breast cancer Other        other relative   Colon cancer Other        parent   Hyperlipidemia Other    Heart disease Other  Social History   Socioeconomic History   Marital status: Married    Spouse name: Not on file   Number of children: Not on file   Years of education: Not on file   Highest education level: Not on file  Occupational History   Not on file  Tobacco Use   Smoking status: Former    Types: Cigarettes    Quit date: 10/20/1999    Years since quitting: 21.6   Smokeless tobacco: Never   Tobacco comments:    Married, lives with spouse (who is s/p tx rectal cancer-[place])  Substance and Sexual Activity   Alcohol use: Yes    Comment: occassional   Drug use: No   Sexual activity: Not on file  Other Topics Concern   Not on file  Social History Narrative   Not on file   Social Determinants of Health   Financial Resource Strain: Not on file  Food Insecurity: Not on file  Transportation Needs: Not on file  Physical Activity: Not on file  Stress: Not on file  Social Connections: Not on file  Intimate Partner Violence: Not on file     BP 128/86   Pulse (!) 56   Ht 5\' 5"  (1.651 m)   Wt 235 lb  (106.6 kg)   SpO2 95%   BMI 39.11 kg/m   Physical Exam:  obese appearing woman, NAD HEENT: Unremarkable Neck:  No JVD, no thyromegally Lymphatics:  No adenopathy Back:  No CVA tenderness Lungs:  Clear with no wheezes HEART:  Regular rate rhythm, no murmurs, no rubs, no clicks Abd:  soft, positive bowel sounds, no organomegally, no rebound, no guarding Ext:  2 plus pulses, no edema, no cyanosis, no clubbing Skin:  No rashes no nodules Neuro:  CN II through XII intact, motor grossly intact  EKG - nsr  Assess/Plan:  PAF - she is symptomatic but not severely so. We discussed the various treatment options. Flecainide would be a consideration but she does not impress me that she is particularly symptomatic. For now I recommend she work on weight loss, caffeine avoidance, and not drinking any ETOH. If her symptoms were to worsen then adding flecainide would be a consideration. We also discussed catheter ablation.  Obesity - I strongly encouraged weight loss. HTN - her bp is fairly well controlled.   , MD

## 2021-06-13 ENCOUNTER — Telehealth: Payer: Self-pay | Admitting: Internal Medicine

## 2021-06-13 NOTE — Telephone Encounter (Signed)
  Are you calling in reference to your FMLA or disability form?  N/A  What is your question in regards to FMLA or disability form?  N/A   Do you need copies of your medical records?  See below  Are you waiting on a nurse to call you back with results or are you wanting copies of your results?   Christy with [facility] is requesting a copy of patient's OV notes from 06/11/21 appointment with Dr. Ladona Ridgel.  Phone #:[phone] Fax#: [phone]

## 2021-06-20 NOTE — Telephone Encounter (Signed)
Sending OV notes per ok by Pt.

## 2021-06-24 ENCOUNTER — Ambulatory Visit: Payer: BC Managed Care – PPO | Admitting: Physician Assistant

## 2021-06-24 ENCOUNTER — Telehealth: Payer: Self-pay

## 2021-06-24 ENCOUNTER — Encounter: Payer: Self-pay | Admitting: Physician Assistant

## 2021-06-24 ENCOUNTER — Other Ambulatory Visit: Payer: Self-pay

## 2021-06-24 ENCOUNTER — Ambulatory Visit
Admission: RE | Admit: 2021-06-24 | Discharge: 2021-06-24 | Disposition: A | Discharge status: Home / Self Care | Payer: BC Managed Care – PPO | Source: Ambulatory Visit | Attending: Gynecology | Admitting: Gynecology

## 2021-06-24 VITALS — BP 122/72 | HR 50 | Ht 65.0 in | Wt 231.4 lb

## 2021-06-24 DIAGNOSIS — Z1211 Encounter for screening for malignant neoplasm of colon: Secondary | ICD-10-CM | POA: Diagnosis not present

## 2021-06-24 DIAGNOSIS — Z1231 Encounter for screening mammogram for malignant neoplasm of breast: Secondary | ICD-10-CM

## 2021-06-24 NOTE — Patient Instructions (Signed)
If you are age 63 or younger, your body mass index should be between 19-25. Your Body mass index is 38.5 kg/m. If this is out of the aformentioned range listed, please consider follow up with your Primary Care Provider.  __________________________________________________________  The [facility] providers would like to encourage you to use [facility] to communicate with providers for non-urgent requests or questions.  Due to long hold times on the telephone, sending your provider a message by [facility] may be a faster and more efficient way to get a response.  Please allow 48 business hours for a response.  Please remember that this is for non-urgent requests.   You have been scheduled for a colonoscopy. Please follow written instructions given to you at your visit today.  Please pick up your prep supplies at the pharmacy within the next 1-3 days. If you use inhalers (even only as needed), please bring them with you on the day of your procedure.  Follow up pending the results of your Colonoscopy or as needed.  Thank you for entrusting me with your care and choosing [facility].  Amy Esterwood, PA-C

## 2021-06-24 NOTE — Telephone Encounter (Signed)
[facility] Pre-operative Risk Assessment     Request for surgical clearance:     Endoscopy Procedure  What type of surgery is being performed?     Colonoscopy  When is this surgery scheduled?     08/12/2021  What type of clearance is required ?   Pharmacy  Are there any medications that need to be held prior to surgery and how long? Eliquis starting 2 days prior  Practice name and name of physician performing surgery?      [facility]  What is your office phone and fax number?      Phone- [phone]  [phone]  Anesthesia type (None, local, MAC, general) ?       MAC

## 2021-06-24 NOTE — Progress Notes (Signed)
Subjective:    Patient ID: Diane Perkins, female    DOB: 11/15/1955, 65 y.o.   MRN: [id number]  HPI  Diane Perkins is a pleasant 65 year old white female, self-referred today to discuss follow-up colonoscopy. Patient was last seen in 2011 when she had colonoscopy with Dr. Arlyce Dice.  This was a negative exam with exception of sigmoid diverticulosis and she was indicated for 10-year interval follow-up. Patient says she has been doing well and has no current GI complaints.  Specifically no problems with abdominal pain, changes in bowel habits melena or hematochezia. She does have family history of squamous cell anal cancer in her mother. Patient has history of atrial fibrillation, obesity, and hypertension.  She is maintained on Eliquis and followed by Dr. Lingle/cardiology [name]. Dr. Leone Payor has cared for her husband and her mother and she would like to be established with Dr. Leone Payor.  Review of Systems Pertinent positive and negative review of systems were noted in the above HPI section.  All other review of systems was otherwise negative.   Outpatient Encounter Medications as of 06/24/2021  Medication Sig   Biotin 10 MG CAPS Take 10 mg by mouth daily with breakfast.   ELIQUIS 5 MG TABS tablet Take 5 mg by mouth 2 (two) times daily.   loratadine (CLARITIN) 10 MG tablet Take 10 mg by mouth daily.   metoprolol tartrate (LOPRESSOR) 25 MG tablet Take 25 mg by mouth 2 (two) times daily.   Multiple Vitamins-Minerals (ICAPS AREDS FORMULA PO) Take 1 capsule by mouth daily with breakfast.   Multiple Vitamins-Minerals (ONE-A-DAY WOMENS 50 PLUS PO) Take 1 tablet by mouth daily with breakfast.   rosuvastatin (CRESTOR) 10 MG tablet Take 10 mg by mouth every evening.   [DISCONTINUED] ascorbic acid (VITAMIN C) 500 MG tablet Take 500-1,000 mg by mouth daily. (Patient not taking: Reported on 06/11/2021)   [DISCONTINUED] Cholecalciferol (VITAMIN D3) 50 MCG (2000 UT) TABS Take 2,000 Units by mouth daily. (Patient not  taking: Reported on 06/11/2021)   [DISCONTINUED] cyclobenzaprine (FLEXERIL) 10 MG tablet Take 1 tablet (10 mg total) by mouth 2 (two) times daily as needed for muscle spasms.   [DISCONTINUED] Magnesium 500 MG TABS Take 500 mg by mouth daily after breakfast. (Patient not taking: Reported on 06/11/2021)   [DISCONTINUED] Melatonin 10 MG TABS Take 10 mg by mouth at bedtime.   [DISCONTINUED] zinc gluconate 50 MG tablet Take 50 mg by mouth daily with breakfast. (Patient not taking: Reported on 06/11/2021)   No facility-administered encounter medications on file as of 06/24/2021.   No Known Allergies Patient Active Problem List   Diagnosis Date Noted   Paroxysmal atrial fibrillation (HCC) 06/11/2021   Hair loss 08/20/2014   Fatigue 08/20/2014   Ganglion cyst of the right posterior tibialis tendon 06/03/2011   OBESITY 09/30/2009   ANEMIA-NOS 09/30/2009   CHICKENPOX, HX OF 09/30/2009   Social History   Socioeconomic History   Marital status: Married    Spouse name: Not on file   Number of children: 2   Years of education: Not on file   Highest education level: Not on file  Occupational History   Not on file  Tobacco Use   Smoking status: Former    Types: Cigarettes    Quit date: 10/20/1999    Years since quitting: 21.6   Smokeless tobacco: Never   Tobacco comments:    Married, lives with spouse (who is s/p tx rectal cancer-Diane Perkins)  Vaping Use   Vaping Use: Never used  Substance  and Sexual Activity   Alcohol use: Yes    Comment: occassional   Drug use: No   Sexual activity: Not on file  Other Topics Concern   Not on file  Social History Narrative   Not on file   Social Determinants of Health   Financial Resource Strain: Not on file  Food Insecurity: Not on file  Transportation Needs: Not on file  Physical Activity: Not on file  Stress: Not on file  Social Connections: Not on file  Intimate Partner Violence: Not on file    Diane Perkins's family history includes Arthritis in  an other family member; Breast cancer in an other family member; Colon cancer in an other family member; Diabetes in her father; Heart disease in an other family member; Hyperlipidemia in an other family member.      Objective:    Vitals:   06/24/21 0824  BP: 122/72  Pulse: (!) 50    Physical Exam Well-developed well-nourished older WF in no acute distress.  Height, Weight,231  BMI 38.5  HEENT; nontraumatic normocephalic, EOMI, PE R LA, sclera anicteric. Oropharynx;not examined today  Neck; supple, no JVD Cardiovascular; regular rate and rhythm with S1-S2, no murmur rub or gallop Pulmonary; Clear bilaterally Abdomen; soft, obese ,nontender, nondistended, no palpable mass or hepatosplenomegaly, bowel sounds are active Rectal; not done Skin; benign exam, no jaundice rash or appreciable lesions Extremities; no clubbing cyanosis or edema skin warm and dry Neuro/Psych; alert and oriented x4, grossly nonfocal mood and affect appropriate        Assessment & Plan:   #22 65 year old white female due for screening colonoscopy.  Currently asymptomatic.  Last exam 2011 (Dr. Arlyce Dice) negative with exception of sigmoid diverticulosis  #2 chronic anticoagulation-on Eliquis #3.  Atrial fibrillation #4.  Hypertension #5.  Obesity BMI 38.5 #6 family history of squamous cell anal cancer/mother  Plan; patient will be scheduled for colonoscopy with Dr. Leone Payor.  Procedure was discussed in detail with the patient including indications risk and benefits and she is agreeable to proceed. As of today's visit patient is appropriate for endoscopic evaluation in the ambulatory care setting. She will need to hold Eliquis for 48 hours prior to procedure.  We will communicate with her cardiologist/Dr. Shannan Harper to assure this is reasonable for this patient.     Diane Perkins S Othar Curto PA-C 06/24/2021   Cc: Diane Dauer, MD

## 2021-07-07 NOTE — Telephone Encounter (Signed)
Diane Boop, MD  Arville Care, CMA; Esterwood, Amy S, PA-C OK I agree        Previous Messages   ----- Message -----  From: Arville Care, CMA  Sent: 07/04/2021  11:11 AM EDT  To: Diane Boop, MD  Subject: Anti-coag hold                                 Received confirmation on hold patient's Eliquis from Dr. Altamease Oiler, which is the patient's Cardiologist. Per his note, she has been cleared for her colonoscopy  and is low risk, but she does not need to hold her Eliquis for 2 days prior to procedure, this is not within guidelines. He has approved her to hold her Eliquis starting the night prior to her procedure.   Thanks

## 2021-07-07 NOTE — Telephone Encounter (Signed)
Left message for patient to call back. Need to make sure I giver her the correct stop time for her Eliquis, as her Cardiologist says she needs to only hold it starting the afternoon prior to her procedure.

## 2021-07-09 NOTE — Telephone Encounter (Signed)
Left message for patient to advise that I would send her a message through my chart about holding her Eliquis.

## 2021-08-12 ENCOUNTER — Encounter: Payer: BC Managed Care – PPO | Admitting: Internal Medicine

## 2021-12-02 ENCOUNTER — Ambulatory Visit: Payer: BC Managed Care – PPO | Admitting: Internal Medicine

## 2021-12-03 ENCOUNTER — Other Ambulatory Visit: Payer: Self-pay

## 2021-12-03 ENCOUNTER — Encounter: Payer: Self-pay | Admitting: Internal Medicine

## 2021-12-03 ENCOUNTER — Ambulatory Visit: Payer: BC Managed Care – PPO | Admitting: Internal Medicine

## 2021-12-03 VITALS — BP 140/90 | HR 55 | Ht 65.0 in | Wt 241.6 lb

## 2021-12-03 DIAGNOSIS — I480 Paroxysmal atrial fibrillation: Secondary | ICD-10-CM | POA: Diagnosis not present

## 2021-12-03 MED ORDER — FLECAINIDE ACETATE 50 MG PO TABS: 50.0000 mg | ORAL_TABLET | Freq: Two times a day (BID) | ORAL | 3 refills | Status: DC

## 2021-12-03 NOTE — Progress Notes (Signed)
HPI Diane Perkins is referred today for evaluation of atrial fib. She is a pleasant 66 yo woman from [place] of [place]. She has had atrial fib diagnosed for over a year and has been told that she is out of rhythm about 6% of the time. She has palpitations. She denies chest pain or sob. She is limited in her activity by arthritic complaints and has become sedentary. She has gained another 6 lbs in the last year. She denies syncope. She has been placed on Eliquis and a beta blocker.  She thinks that her atrial fib has worsened with symptoms more frequent, lasting longer and more debilitating.  Allergies  Allergen Reactions   Tape      Current Outpatient Medications  Medication Sig Dispense Refill   Biotin 10 MG CAPS Take 10 mg by mouth daily with breakfast.     co-enzyme Q-10 30 MG capsule Take 30 mg by mouth 3 (three) times daily.     ELIQUIS 5 MG TABS tablet Take 5 mg by mouth 2 (two) times daily.     FLUoxetine (PROZAC) 10 MG tablet Take 10 mg by mouth daily.     loratadine (CLARITIN) 10 MG tablet Take 10 mg by mouth daily.     metoprolol tartrate (LOPRESSOR) 50 MG tablet metoprolol tartrate 50 mg tablet     Multiple Vitamins-Minerals (ICAPS AREDS FORMULA PO) Take 1 capsule by mouth daily with breakfast.     Multiple Vitamins-Minerals (ONE-A-DAY WOMENS 50 PLUS PO) Take 1 tablet by mouth daily with breakfast.     rosuvastatin (CRESTOR) 10 MG tablet Take 10 mg by mouth every evening.     No current facility-administered medications for this visit.     Past Medical History:  Diagnosis Date   A-fib (HCC)    ANEMIA-NOS    Degenerative arthritis    Fatigue    Fibrocystic breast    Right Breast-hx of cysts-benign   GERD (gastroesophageal reflux disease)    Hyperlipidemia    OBESITY    Status post right knee replacement     ROS:   All systems reviewed and negative except as noted in the HPI.   Past Surgical History:  Procedure Laterality Date   APPENDECTOMY  1981    BREAST SURGERY     breast biopsy   CHOLECYSTECTOMY  2001   HAND SURGERY     right ganglion cyst and below right scapula removed   KNEE SURGERY     orthoscopic clean out   lower back fusion     SHOULDER SURGERY     TONSILLECTOMY  1972     Family History  Problem Relation Age of Onset   Diabetes Father    Arthritis Other        grandparent, parent   Breast cancer Other        other relative   Colon cancer Other        parent   Hyperlipidemia Other    Heart disease Other      Social History   Socioeconomic History   Marital status: Married    Spouse name: Not on file   Number of children: 2   Years of education: Not on file   Highest education level: Not on file  Occupational History   Not on file  Tobacco Use   Smoking status: Former    Types: Cigarettes    Quit date: 10/20/1999    Years since quitting: 22.1   Smokeless tobacco:  Never   Tobacco comments:    Married, lives with spouse (who is s/p tx rectal cancer-Diane Perkins)  Vaping Use   Vaping Use: Never used  Substance and Sexual Activity   Alcohol use: Yes    Comment: occassional   Drug use: No   Sexual activity: Not on file  Other Topics Concern   Not on file  Social History Narrative   Not on file   Social Determinants of Health   Financial Resource Strain: Not on file  Food Insecurity: Not on file  Transportation Needs: Not on file  Physical Activity: Not on file  Stress: Not on file  Social Connections: Not on file  Intimate Partner Violence: Not on file     BP 140/90    Pulse (!) 55    Ht 5\' 5"  (1.651 m)    Wt 241 lb 9.6 oz (109.6 kg)    SpO2 98%    BMI 40.20 kg/m   Physical Exam:  Obese but well appearing NAD HEENT: Unremarkable Neck:  No JVD, no thyromegally Lymphatics:  No adenopathy Back:  No CVA tenderness Lungs:  Clear with no wheezes HEART:  Regular rate rhythm, no murmurs, no rubs, no clicks Abd:  soft, positive bowel sounds, no organomegally, no rebound, no guarding Ext:  2  plus pulses, no edema, no cyanosis, no clubbing Skin:  No rashes no nodules Neuro:  CN II through XII intact, motor grossly intact  Assess/Plan:  PAF - we discussed the treatment options. Her BMI of over 40 makes her a poor candidate for atrial fib ablation. I have asked her to start taking flecainide 50 mg twice daily with her beta blocker. We will uptitrate to symptom relief and ECG. Morbid obesity - I encouraged her to work harder on losing weight as she contiues to gain.  HTN - her bp is elevated today. She thinks that it is better at home.  Coags - she will continue eliquis.   Diane Bolls,MD

## 2021-12-03 NOTE — Patient Instructions (Signed)
Medication Instructions:   Start Flecainide 50 mg Two Times Daily   *If you need a refill on your cardiac medications before your next appointment, please call your pharmacy*   Lab Work: NONE   If you have labs (blood work) drawn today and your tests are completely normal, you will receive your results only by: MyChart Message (if you have MyChart) OR A paper copy in the mail If you have any lab test that is abnormal or we need to change your treatment, we will call you to review the results.   Testing/Procedures: NONE    Follow-Up: At [facility]: [facility], you and your health needs are our priority.  As part of our continuing mission to provide you with exceptional heart care, we have created designated Provider Care Teams.  These Care Teams include your primary Cardiologist (physician) and Advanced Practice Providers (APPs -  Physician Assistants and Nurse Practitioners) who all work together to provide you with the care you need, when you need it.  We recommend signing up for the patient portal called "MyChart".  Sign up information is provided on this After Visit Summary.  MyChart is used to connect with patients for Virtual Visits (Telemedicine).  Patients are able to view lab/test results, encounter notes, upcoming appointments, etc.  Non-urgent messages can be sent to your provider as well.   To learn more about what you can do with MyChart, go to [email or web address].    Your next appointment:   3 month(s)  The format for your next appointment:   In Person  Provider:   Lewayne Bunting, MD    Other Instructions Thank you for choosing [facility]!

## 2021-12-17 ENCOUNTER — Other Ambulatory Visit: Payer: Self-pay

## 2021-12-17 ENCOUNTER — Ambulatory Visit (INDEPENDENT_AMBULATORY_CARE_PROVIDER_SITE_OTHER): Payer: BC Managed Care – PPO | Admitting: *Deleted

## 2021-12-17 DIAGNOSIS — I480 Paroxysmal atrial fibrillation: Secondary | ICD-10-CM

## 2021-12-17 NOTE — Progress Notes (Signed)
Pt in office for EKG after starting Flecainide 50 mg BID. 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 When she does this she notices that her heart rate increases on her watch.Please advise.   ?

## 2022-01-28 ENCOUNTER — Telehealth: Payer: Self-pay | Admitting: Internal Medicine

## 2022-01-28 NOTE — Telephone Encounter (Signed)
Pt states that she would like to stop taking flecainide b/c it is causing her to sweat a lot, and when starts to sweat she looks at her watch her hr is elevated but it comes back down. She state that she does not feel that it has helped her a-Fib. Pt denies SOB  and rash at this time. Please advise.  ?

## 2022-01-28 NOTE — Telephone Encounter (Signed)
? ?  Pt c/o medication issue: ? ?1. Name of Medication: flecainide (TAMBOCOR) 50 MG tablet ? ?2. How are you currently taking this medication (dosage and times per day)?  Take 1 tablet (50 mg total) by mouth 2 (two) times daily. ? ?3. Are you having a reaction (difficulty breathing--STAT)? ? ?4. What is your medication issue? Pt requesting if she can get off of this meds. She said it makes her sweat a lot and making her HR fluctuates so bad ?

## 2022-02-02 NOTE — Telephone Encounter (Signed)
Per Dr. Ladona Ridgel- "ok to hold flecainide-if sweating no better or if symptoms worsen may restart" ? ?Forwarding to RDS triage. ?

## 2022-02-02 NOTE — Telephone Encounter (Signed)
I spoke with patient. She states she had already stopped the flecainide last week and had a "horrible spell" with her a-fib last night and has restarted the flecainide. She states " I will just sweat" ?

## 2022-03-11 ENCOUNTER — Ambulatory Visit: Payer: BC Managed Care – PPO | Admitting: Internal Medicine

## 2022-03-11 ENCOUNTER — Encounter: Payer: Self-pay | Admitting: Internal Medicine

## 2022-03-11 VITALS — BP 134/92 | HR 53 | Ht 65.5 in | Wt 223.4 lb

## 2022-03-11 DIAGNOSIS — I480 Paroxysmal atrial fibrillation: Secondary | ICD-10-CM

## 2022-03-11 MED ORDER — FLECAINIDE ACETATE 150 MG PO TABS: 75.0000 mg | ORAL_TABLET | Freq: Two times a day (BID) | ORAL | 3 refills | Status: DC

## 2022-03-11 MED ORDER — METOPROLOL TARTRATE 25 MG PO TABS: 25.0000 mg | ORAL_TABLET | Freq: Two times a day (BID) | ORAL | 3 refills | Status: DC

## 2022-03-11 NOTE — Patient Instructions (Signed)
Medication Instructions:   Decrease Lopressor to 25 mg Two Times Daily  Increase Flecainide to 75 mg Two Times Daily   *If you need a refill on your cardiac medications before your next appointment, please call your pharmacy*   Lab Work: NONE   If you have labs (blood work) drawn today and your tests are completely normal, you will receive your results only by: MyChart Message (if you have MyChart) OR A paper copy in the mail If you have any lab test that is abnormal or we need to change your treatment, we will call you to review the results.   Testing/Procedures: NONE   Follow-Up: At [facility], you and your health needs are our priority.  As part of our continuing mission to provide you with exceptional heart care, we have created designated Provider Care Teams.  These Care Teams include your primary Cardiologist (physician) and Advanced Practice Providers (APPs -  Physician Assistants and Nurse Practitioners) who all work together to provide you with the care you need, when you need it.  We recommend signing up for the patient portal called "MyChart".  Sign up information is provided on this After Visit Summary.  MyChart is used to connect with patients for Virtual Visits (Telemedicine).  Patients are able to view lab/test results, encounter notes, upcoming appointments, etc.  Non-urgent messages can be sent to your provider as well.   To learn more about what you can do with MyChart, go to [email or web address].    Your next appointment:   6 month(s)  The format for your next appointment:   In Person  Provider:   Lewayne Bunting, MD    Other Instructions Thank you for choosing [facility]!    Important Information About Sugar

## 2022-03-11 NOTE — Progress Notes (Signed)
HPI Mrs. Diane Perkins returns today for followup of PAF. He is an obese woman with PAF, HTN, sinus node dysfunction, who we started on low dose flecainide several months ago. In the interim she has done better with fewer palpitations. She denies chest pain or sob. She has been on a diet and lost 20 lbs. She has not had syncope.  Allergies  Allergen Reactions   Tape      Current Outpatient Medications  Medication Sig Dispense Refill   Biotin 10 MG CAPS Take 10 mg by mouth daily with breakfast.     co-enzyme Q-10 30 MG capsule Take 30 mg by mouth 3 (three) times daily.     ELIQUIS 5 MG TABS tablet Take 5 mg by mouth 2 (two) times daily.     flecainide (TAMBOCOR) 50 MG tablet Take 1 tablet (50 mg total) by mouth 2 (two) times daily. 180 tablet 3   FLUoxetine (PROZAC) 10 MG tablet Take 10 mg by mouth daily.     loratadine (CLARITIN) 10 MG tablet Take 10 mg by mouth daily.     metoprolol tartrate (LOPRESSOR) 50 MG tablet metoprolol tartrate 50 mg tablet     Multiple Vitamins-Minerals (ICAPS AREDS FORMULA PO) Take 1 capsule by mouth daily with breakfast.     Multiple Vitamins-Minerals (ONE-A-DAY WOMENS 50 PLUS PO) Take 1 tablet by mouth daily with breakfast.     rosuvastatin (CRESTOR) 10 MG tablet Take 10 mg by mouth every evening.     No current facility-administered medications for this visit.     Past Medical History:  Diagnosis Date   A-fib ([place])    ANEMIA-NOS    Degenerative arthritis    Fatigue    Fibrocystic breast    Right Breast-hx of cysts-benign   GERD (gastroesophageal reflux disease)    Hyperlipidemia    OBESITY    Status post right knee replacement     ROS:   All systems reviewed and negative except as noted in the HPI.   Past Surgical History:  Procedure Laterality Date   APPENDECTOMY  1981   BREAST SURGERY     breast biopsy   CHOLECYSTECTOMY  2001   HAND SURGERY     right ganglion cyst and below right scapula removed   KNEE SURGERY     orthoscopic  clean out   lower back fusion     SHOULDER SURGERY     TONSILLECTOMY  1972     Family History  Problem Relation Age of Onset   Diabetes Father    Arthritis Other        grandparent, parent   Breast cancer Other        other relative   Colon cancer Other        parent   Hyperlipidemia Other    Heart disease Other      Social History   Socioeconomic History   Marital status: Married    Spouse name: Not on file   Number of children: 2   Years of education: Not on file   Highest education level: Not on file  Occupational History   Not on file  Tobacco Use   Smoking status: Former    Types: Cigarettes    Quit date: 10/20/1999    Years since quitting: 22.4   Smokeless tobacco: Never   Tobacco comments:    Married, lives with spouse (who is s/p tx rectal cancer-[place]ril)  Vaping Use   Vaping Use: Never used  Substance and Sexual Activity   Alcohol use: Yes    Comment: occassional   Drug use: No   Sexual activity: Not on file  Other Topics Concern   Not on file  Social History Narrative   Not on file   Social Determinants of Health   Financial Resource Strain: Not on file  Food Insecurity: Not on file  Transportation Needs: Not on file  Physical Activity: Not on file  Stress: Not on file  Social Connections: Not on file  Intimate Partner Violence: Not on file     BP (!) 134/92   Pulse (!) 53   Ht 5' 5.5" (1.664 m)   Wt 223 lb 6.4 oz (101.3 kg)   SpO2 97%   BMI 36.61 kg/m   Physical Exam:  Well appearing NAD HEENT: Unremarkable Neck:  No JVD, no thyromegally Lymphatics:  No adenopathy Back:  No CVA tenderness Lungs:  Clear with no wheezes HEART:  Regular rate rhythm, no murmurs, no rubs, no clicks Abd:  soft, positive bowel sounds, no organomegally, no rebound, no guarding Ext:  2 plus pulses, no edema, no cyanosis, no clubbing Skin:  No rashes no nodules Neuro:  CN II through XII intact, motor grossly intact  Assess/Plan:  PAF - I have asked  her to increase her flecainide to 75 mg twice daily. She will reduce her dose of metoprolol to 25 mg twice daily. Sinus node dysfunction - she is asymptomatic but I asked her to reduce her dose of metoprolol to 25 mg twice daily. Obesity - she has lost 20 lbs. I encouraged her to keep losing. I think she would need to get down to 180 before referral for atrial fib ablation. Coags - she will continue eliquis. No bleeding.  Carleene Overlie Jannine Abreu,MD

## 2022-03-12 ENCOUNTER — Other Ambulatory Visit: Payer: Self-pay | Admitting: Internal Medicine

## 2022-03-12 ENCOUNTER — Telehealth: Payer: Self-pay | Admitting: Internal Medicine

## 2022-03-12 MED ORDER — ELIQUIS 5 MG PO TABS: 5.0000 mg | ORAL_TABLET | Freq: Two times a day (BID) | ORAL | 0 refills | Status: DC

## 2022-03-12 MED ORDER — FLECAINIDE ACETATE 150 MG PO TABS: 75.0000 mg | ORAL_TABLET | Freq: Two times a day (BID) | ORAL | 3 refills | Status: DC

## 2022-03-12 MED ORDER — METOPROLOL TARTRATE 25 MG PO TABS: 25.0000 mg | ORAL_TABLET | Freq: Two times a day (BID) | ORAL | 3 refills | Status: AC

## 2022-03-12 NOTE — Telephone Encounter (Signed)
Metop/F[facility]c sent to [facility] per pt's request.

## 2022-03-12 NOTE — Telephone Encounter (Signed)
Prescription refill request for Eliquis received. Indication: afib  Last office visit: Ladona Ridgel 03/11/2022 Scr: 0.72, 11/18/2020 Age: 66 yo  Weight: 101.3 kg   Pt is overdue for blood work. Attempted to call pt. No answer.

## 2022-03-12 NOTE — Telephone Encounter (Signed)
Called and spoke to pt who stated that she is suppose to get blood work on 6/20 at her PCP office. Pt stated that she would fax over a copy of the results to [phone] whenever she gets the results. Informed pt that I would send in refill for a 1 month supply.

## 2022-03-12 NOTE — Telephone Encounter (Signed)
*  STAT* If patient is at the pharmacy, call can be transferred to refill team.   1. Which medications need to be refilled? (please list name of each medication and dose if known) metoprolol tartrate (LOPRESSOR) 25 MG tablet flecainide (TAMBOCOR) 150 MG tablet ELIQUIS 5 MG TABS tablet  2. Which pharmacy/location (including street and city if local pharmacy) is medication to be sent to? [id number] [name], [place] - [address] AT [address]  3. Do they need a 30 day or 90 day supply?  90  Pt states that refills were sent to the wrong pharmacy and request that meds be sent to new pharmacy.

## 2022-03-13 NOTE — Telephone Encounter (Addendum)
Prescription refill request for Eliquis received. Indication: afib  Last office visit: 03/11/2022, Ladona Ridgel Scr: 0.72, 11/18/2020 Age: 67 yo  Weight: 101.3 kg   Pharmacy requesting 90 day supply for insurance proposes instead of 1 month supply.  Called and spoke to pt yesterday ( see phone note from 5/25 for further documentation) Pt is suppose to be getting blood work from her PCP office on 6/20. Pt's PCP Dr. Suzy Bouchard [facility] [place]) Will send in refill.

## 2022-04-07 ENCOUNTER — Encounter: Payer: Self-pay | Admitting: Internal Medicine

## 2022-04-14 ENCOUNTER — Ambulatory Visit (INDEPENDENT_AMBULATORY_CARE_PROVIDER_SITE_OTHER): Payer: BC Managed Care – PPO | Admitting: *Deleted

## 2022-04-14 VITALS — BP 124/80

## 2022-04-14 DIAGNOSIS — I480 Paroxysmal atrial fibrillation: Secondary | ICD-10-CM | POA: Diagnosis not present

## 2022-04-14 NOTE — Progress Notes (Signed)
Pt in for EKG following medication adjustment.

## 2022-05-13 ENCOUNTER — Telehealth: Payer: Self-pay | Admitting: *Deleted

## 2022-05-13 NOTE — Telephone Encounter (Signed)
Pt agreeable to plan of care for tele pre op appt 05/21/22 @ 3 pm. Med rec and consent are done.

## 2022-05-13 NOTE — Telephone Encounter (Signed)
Surgery is in 1-month, would recommend virtual telephone visit in August for cardiac clearance.

## 2022-05-13 NOTE — Telephone Encounter (Signed)
Clinical pharmacist to review Eliquis 

## 2022-05-13 NOTE — Telephone Encounter (Signed)
   Pre-operative Risk Assessment    Patient Name: Diane Perkins  DOB: 06/07/1956 MRN: [id number]      Request for Surgical Clearance    Procedure:   LEFT KNEE SCOPE  Date of Surgery:  Clearance 07/07/22                                 Surgeon:  DR. Ollen Gross Surgeon's Group or Practice Name:  [facility] Phone number:  [phone] Fax number:  [phone] ATTN: Aida Raider   Type of Clearance Requested:   - Medical  - Pharmacy:  Hold Apixaban (Eliquis)     Type of Anesthesia:   CHOICE   Additional requests/questions:    Elpidio Anis   05/13/2022, 2:09 PM

## 2022-05-13 NOTE — Telephone Encounter (Signed)
Pt agreeable to plan of care for tele pre op appt 05/21/22 @ 3 pm. Med rec and consent are done.      Patient Consent for Virtual Visit        Harmony Sandell has provided verbal consent on 05/13/2022 for a virtual visit (video or telephone).   CONSENT FOR VIRTUAL VISIT FOR:  Heran Campau Divelbiss  By participating in this virtual visit I agree to the following:  I hereby voluntarily request, consent and authorize [facility] and its employed or contracted physicians, physician assistants, nurse practitioners or other licensed health care professionals (the Practitioner), to provide me with telemedicine health care services (the "Services") as deemed necessary by the treating Practitioner. I acknowledge and consent to receive the Services by the Practitioner via telemedicine. I understand that the telemedicine visit will involve communicating with the Practitioner through live audiovisual communication technology and the disclosure of certain medical information by electronic transmission. I acknowledge that I have been given the opportunity to request an in-person assessment or other available alternative prior to the telemedicine visit and am voluntarily participating in the telemedicine visit.  I understand that I have the right to withhold or withdraw my consent to the use of telemedicine in the course of my care at any time, without affecting my right to future care or treatment, and that the Practitioner or I may terminate the telemedicine visit at any time. I understand that I have the right to inspect all information obtained and/or recorded in the course of the telemedicine visit and may receive copies of available information for a reasonable fee.  I understand that some of the potential risks of receiving the Services via telemedicine include:  Delay or interruption in medical evaluation due to technological equipment failure or disruption; Information transmitted may not be sufficient  (e.g. poor resolution of images) to allow for appropriate medical decision making by the Practitioner; and/or  In rare instances, security protocols could fail, causing a breach of personal health information.  Furthermore, I acknowledge that it is my responsibility to provide information about my medical history, conditions and care that is complete and accurate to the best of my ability. I acknowledge that Practitioner's advice, recommendations, and/or decision may be based on factors not within their control, such as incomplete or inaccurate data provided by me or distortions of diagnostic images or specimens that may result from electronic transmissions. I understand that the practice of medicine is not an exact science and that Practitioner makes no warranties or guarantees regarding treatment outcomes. I acknowledge that a copy of this consent can be made available to me via my patient portal [facility] MyChart), or I can request a printed copy by calling the office of [facility].    I understand that my insurance will be billed for this visit.   I have read or had this consent read to me. I understand the contents of this consent, which adequately explains the benefits and risks of the Services being provided via telemedicine.  I have been provided ample opportunity to ask questions regarding this consent and the Services and have had my questions answered to my satisfaction. I give my informed consent for the services to be provided through the use of telemedicine in my medical care

## 2022-05-15 NOTE — Telephone Encounter (Signed)
Patient with diagnosis of PAF on Eliquis for anticoagulation.    Procedure: left knee scope Date of procedure: 07/07/22   CHA2DS2-VASc Score = 2  This indicates a 2.2% annual risk of stroke. The patient's score is based upon: CHF History: 0 HTN History: 1 Diabetes History: 0 Stroke History: 0 Vascular Disease History: 0 Age Score: 0 Gender Score: 1       CrCl 81 mL/min using adjusted body weight Platelet count 197K  Per office protocol, patient can hold Eliquis for 2 days prior to procedure.    **This guidance is not considered finalized until pre-operative APP has relayed final recommendations.**

## 2022-05-21 ENCOUNTER — Ambulatory Visit (INDEPENDENT_AMBULATORY_CARE_PROVIDER_SITE_OTHER): Payer: BC Managed Care – PPO | Admitting: Physician Assistant

## 2022-05-21 DIAGNOSIS — Z01810 Encounter for preprocedural cardiovascular examination: Secondary | ICD-10-CM | POA: Diagnosis not present

## 2022-05-21 NOTE — Progress Notes (Signed)
Virtual Visit via Telephone Note   Because of Flordia Kassem Diles's co-morbid illnesses, she is at least at moderate risk for complications without adequate follow up.  This format is felt to be most appropriate for this patient at this time.  The patient did not have access to video technology/had technical difficulties with video requiring transitioning to audio format only (telephone).  All issues noted in this document were discussed and addressed.  No physical exam could be performed with this format.  Please refer to the patient's chart for her consent to telehealth for [facility]  Evaluation Performed:  Preoperative cardiovascular risk assessment _____________   Date:  05/21/2022   Patient ID:  Diane Perkins, DOB 07/13/1956, MRN [id number] Patient Location:  Home Provider location:   Office  Primary Care Provider:  Zachery Dauer, MD Primary Cardiologist:  None  Chief Complaint / Patient Profile   66 y.o. y/o female with a h/o atrial fibrillation, anemia, fatigue, GERD, hyperlipidemia who is pending left knee scope and presents today for telephonic preoperative cardiovascular risk assessment.  Past Medical History    Past Medical History:  Diagnosis Date   A-fib ([facility])    ANEMIA-NOS    Degenerative arthritis    Fatigue    Fibrocystic breast    Right Breast-hx of cysts-benign   GERD (gastroesophageal reflux disease)    Hyperlipidemia    OBESITY    Status post right knee replacement    Past Surgical History:  Procedure Laterality Date   APPENDECTOMY  1981   BREAST SURGERY     breast biopsy   CHOLECYSTECTOMY  2001   HAND SURGERY     right ganglion cyst and below right scapula removed   KNEE SURGERY     orthoscopic clean out   lower back fusion     SHOULDER SURGERY     TONSILLECTOMY  1972    Allergies  Allergies  Allergen Reactions   Tape     History of Present Illness    Diane Perkins is a 66 y.o. female who presents via audio/video  conferencing for a telehealth visit today.  Pt was last seen in cardiology clinic on 03/11/2022 by Dr. Ladona Ridgel.  At that time Diane Perkins was doing well.  The patient is now pending procedure as outlined above. Since her last visit, she states she continues to have fewer palpitations, no chest pain or shortness of breath.  She does still feel her heart beating fast from time to time but the episodes are brief and it is much better than it was.  She is able to walk 1-2 blocks on level ground and gets around her house okay.  She can go up and down stairs however she has to take it slowly because she has them partial knee replacement on the one side and a torn meniscus on the other.  She does all her own housework and mows her yard and her mother's yard each week without any significant symptoms.  She is scored a 5.62 METS on the DASI.  This exceeds the minimum requirement of 4 METS.  Per office protocol, patient can hold Eliquis for 2 days prior to procedure.  Reports no shortness of breath nor dyspnea on exertion. Reports no chest pain, pressure, or tightness. No edema, orthopnea, PND.   Home Medications    Prior to Admission medications   Medication Sig Start Date End Date Taking? Authorizing Provider  apixaban (ELIQUIS) 5 MG TABS tablet TAKE 1 TABLET  BY MOUTH TWICE A DAY 03/13/22   Marinus Maw, MD  Biotin 10 MG CAPS Take 10 mg by mouth daily with breakfast.    [provider]  co-enzyme Q-10 30 MG capsule Take 30 mg by mouth 3 (three) times daily.    [provider]  flecainide (TAMBOCOR) 150 MG tablet Take 0.5 tablets (75 mg total) by mouth 2 (two) times daily. 03/12/22   Marinus Maw, MD  FLUoxetine (PROZAC) 10 MG tablet Take 10 mg by mouth daily.    [provider]  loratadine (CLARITIN) 10 MG tablet Take 10 mg by mouth daily.    [provider]  metoprolol tartrate (LOPRESSOR) 25 MG tablet Take 1 tablet (25 mg total) by mouth 2 (two) times daily.  03/12/22 03/07/23  Marinus Maw, MD  Multiple Vitamins-Minerals (ICAPS AREDS FORMULA PO) Take 1 capsule by mouth daily with breakfast.    [provider]  Multiple Vitamins-Minerals (ONE-A-DAY WOMENS 50 PLUS PO) Take 1 tablet by mouth daily with breakfast.    [provider]  rosuvastatin (CRESTOR) 10 MG tablet Take 10 mg by mouth every evening.    [provider]    Physical Exam    Vital Signs:  [name] does not have vital signs available for review today.  Given telephonic nature of communication, physical exam is limited. AAOx3. NAD. Normal affect.  Speech and respirations are unlabored.  Accessory Clinical Findings    None  Assessment & Plan    1.  Preoperative Cardiovascular Risk Assessment:  [name]'s perioperative risk of a major cardiac event is 0.4% according to the Revised Cardiac Risk Index (RCRI).  Therefore, she is at low risk for perioperative complications.   Her functional capacity is good at 5.62 METs according to the [place] Activity Status Index (DASI). Recommendations: According to ACC/AHA guidelines, no further cardiovascular testing needed.  The patient may proceed to surgery at acceptable risk.   Antiplatelet and/or Anticoagulation Recommendations:  Eliquis (Apixaban) can be held for 2 days prior to surgery.  Please resume post op when felt to be safe.     A copy of this note will be routed to requesting surgeon.  Time:   Today, I have spent 7 minutes with the patient with telehealth technology discussing medical history, symptoms, and management plan.     Sharlene Dory, PA-C  05/21/2022, 2:55 PM

## 2022-05-25 ENCOUNTER — Other Ambulatory Visit: Payer: Self-pay | Admitting: Gynecology

## 2022-05-25 DIAGNOSIS — Z1231 Encounter for screening mammogram for malignant neoplasm of breast: Secondary | ICD-10-CM

## 2022-06-17 ENCOUNTER — Other Ambulatory Visit: Payer: Self-pay | Admitting: Internal Medicine

## 2022-06-17 NOTE — Telephone Encounter (Signed)
Prescription refill request for Eliquis received. Indication: PAF Last office visit: 03/11/22  Rosette Reveal MD Scr: 0.76 on 04/07/22 Age: 66 Weight: 101.3kg  Based on above findings Eliquis 5mg  twice daily is the appropriate dose.  Refill approved.

## 2022-07-13 ENCOUNTER — Ambulatory Visit
Admission: RE | Admit: 2022-07-13 | Discharge: 2022-07-13 | Disposition: A | Discharge status: Home / Self Care | Payer: BC Managed Care – PPO | Source: Ambulatory Visit | Attending: Gynecology | Admitting: Gynecology

## 2022-07-13 DIAGNOSIS — Z1231 Encounter for screening mammogram for malignant neoplasm of breast: Secondary | ICD-10-CM

## 2022-07-15 ENCOUNTER — Other Ambulatory Visit: Payer: Self-pay | Admitting: Obstetrics and Gynecology

## 2022-07-15 DIAGNOSIS — E2839 Other primary ovarian failure: Secondary | ICD-10-CM

## 2022-08-11 ENCOUNTER — Other Ambulatory Visit: Payer: Self-pay | Admitting: Internal Medicine

## 2022-08-12 MED ORDER — FLECAINIDE ACETATE 150 MG PO TABS: 75.0000 mg | ORAL_TABLET | Freq: Two times a day (BID) | ORAL | 3 refills | Status: DC

## 2022-09-01 ENCOUNTER — Other Ambulatory Visit: Payer: Self-pay | Admitting: Internal Medicine

## 2022-09-15 ENCOUNTER — Ambulatory Visit: Payer: BC Managed Care – PPO | Attending: Internal Medicine | Admitting: Internal Medicine

## 2022-09-15 ENCOUNTER — Encounter: Payer: Self-pay | Admitting: Internal Medicine

## 2022-09-15 VITALS — BP 110/74 | HR 56 | Ht 65.5 in | Wt 208.0 lb

## 2022-09-15 DIAGNOSIS — I480 Paroxysmal atrial fibrillation: Secondary | ICD-10-CM

## 2022-09-15 MED ORDER — METOPROLOL TARTRATE 25 MG PO TABS: 25.0000 mg | ORAL_TABLET | Freq: Two times a day (BID) | ORAL | 3 refills | Status: AC

## 2022-09-15 NOTE — Progress Notes (Signed)
HPI Diane Perkins returns today for followup of PAF. He is an obese woman with PAF, HTN, sinus node dysfunction, who we started on low dose flecainide several months ago. In the interim she has done better with fewer palpitations. She denies chest pain or sob. She has been on a diet and lost another 15 lbs. She has not had syncope.  She has tried intermittent fasting.  Allergies  Allergen Reactions   Tape      Current Outpatient Medications  Medication Sig Dispense Refill   apixaban (ELIQUIS) 5 MG TABS tablet TAKE 1 TABLET BY MOUTH TWICE A DAY 180 tablet 1   Biotin 10 MG CAPS Take 10 mg by mouth daily with breakfast.     co-enzyme Q-10 30 MG capsule Take 30 mg by mouth 3 (three) times daily.     flecainide (TAMBOCOR) 150 MG tablet Take 0.5 tablets (75 mg total) by mouth 2 (two) times daily. 90 tablet 3   FLUoxetine (PROZAC) 10 MG tablet Take 10 mg by mouth daily.     loratadine (CLARITIN) 10 MG tablet Take 10 mg by mouth daily.     metoprolol tartrate (LOPRESSOR) 25 MG tablet Take 1 tablet (25 mg total) by mouth 2 (two) times daily. 180 tablet 3   Multiple Vitamins-Minerals (ICAPS AREDS FORMULA PO) Take 1 capsule by mouth daily with breakfast.     Multiple Vitamins-Minerals (ONE-A-DAY WOMENS 50 PLUS PO) Take 1 tablet by mouth daily with breakfast.     rosuvastatin (CRESTOR) 10 MG tablet Take 10 mg by mouth every evening.     No current facility-administered medications for this visit.     Past Medical History:  Diagnosis Date   A-fib ([facility])    ANEMIA-NOS    Degenerative arthritis    Fatigue    Fibrocystic breast    Right Breast-hx of cysts-benign   GERD (gastroesophageal reflux disease)    Hyperlipidemia    OBESITY    Status post right knee replacement     ROS:   All systems reviewed and negative except as noted in the HPI.   Past Surgical History:  Procedure Laterality Date   APPENDECTOMY  1981   BREAST SURGERY     breast biopsy   CHOLECYSTECTOMY  2001    HAND SURGERY     right ganglion cyst and below right scapula removed   KNEE SURGERY     orthoscopic clean out   lower back fusion     SHOULDER SURGERY     TONSILLECTOMY  1972     Family History  Problem Relation Age of Onset   Diabetes Father    Arthritis Other        grandparent, parent   Breast cancer Other        other relative   Colon cancer Other        parent   Hyperlipidemia Other    Heart disease Other      Social History   Socioeconomic History   Marital status: Married    Spouse name: Not on file   Number of children: 2   Years of education: Not on file   Highest education level: Not on file  Occupational History   Not on file  Tobacco Use   Smoking status: Former    Types: Cigarettes    Quit date: 10/20/1999    Years since quitting: 22.9   Smokeless tobacco: Never   Tobacco comments:    Married, lives with spouse (  who is s/p tx rectal cancer-[place])  Vaping Use   Vaping Use: Never used  Substance and Sexual Activity   Alcohol use: Yes    Comment: occassional   Drug use: No   Sexual activity: Not on file  Other Topics Concern   Not on file  Social History Narrative   Not on file   Social Determinants of Health   Financial Resource Strain: Not on file  Food Insecurity: Not on file  Transportation Needs: Not on file  Physical Activity: Not on file  Stress: Not on file  Social Connections: Not on file  Intimate Partner Violence: Not on file     BP 110/74 (BP Location: Left Arm, Patient Position: Sitting, Cuff Size: Large)   Pulse (!) 56   Ht 5' 5.5" (1.664 m)   Wt 208 lb (94.3 kg)   BMI 34.09 kg/m   Physical Exam:  Well appearing NAD HEENT: Unremarkable Neck:  No JVD, no thyromegally Lymphatics:  No adenopathy Back:  No CVA tenderness Lungs:  Clear HEART:  Regular rate rhythm, no murmurs, no rubs, no clicks Abd:  soft, positive bowel sounds, no organomegally, no rebound, no guarding Ext:  2 plus pulses, no edema, no cyanosis, no  clubbing Skin:  No rashes no nodules Neuro:  CN II through XII intact, motor grossly intact  Assess/Plan:  PAF - I have asked her to continue her flecainide 75 mg twice daily. She will reduce her dose of metoprolol to 25 mg twice daily. However if she has break throughs of atrial fib she is instructed to take an additional dose or two of atrial fib. Sinus node dysfunction - she is asymptomatic. Obesity - she has lost another 15 lbs. I encouraged her to keep losing. I think she would need to get down to 180 before referral for atrial fib ablation. Coags - she will continue eliquis. No bleeding. Disp - she is having a change in her insurance and will likely need to find a MD in [place]. I recommended Dr. Georgena Spurling.   Sharlot Gowda Shonique Pelphrey,MD

## 2022-09-15 NOTE — Patient Instructions (Signed)
Medication Instructions:  May Take An Extra Lopressor or two for palpitations   *If you need a refill on your cardiac medications before your next appointment, please call your pharmacy*   Lab Work: NONE   If you have labs (blood work) drawn today and your tests are completely normal, you will receive your results only by: MyChart Message (if you have MyChart) OR A paper copy in the mail If you have any lab test that is abnormal or we need to change your treatment, we will call you to review the results.   Testing/Procedures: NONE    Follow-Up: At [facility], you and your health needs are our priority.  As part of our continuing mission to provide you with exceptional heart care, we have created designated Provider Care Teams.  These Care Teams include your primary Cardiologist (physician) and Advanced Practice Providers (APPs -  Physician Assistants and Nurse Practitioners) who all work together to provide you with the care you need, when you need it.  We recommend signing up for the patient portal called "MyChart".  Sign up information is provided on this After Visit Summary.  MyChart is used to connect with patients for Virtual Visits (Telemedicine).  Patients are able to view lab/test results, encounter notes, upcoming appointments, etc.  Non-urgent messages can be sent to your provider as well.   To learn more about what you can do with MyChart, go to [email or web address].    Your next appointment:    To Be Determined   The format for your next appointment:   In Person  Provider:   Lewayne Bunting, MD    Other Instructions Thank you for choosing [facility]!    Important Information About Sugar

## 2022-09-24 ENCOUNTER — Other Ambulatory Visit: Payer: Self-pay | Admitting: Internal Medicine

## 2022-12-18 ENCOUNTER — Telehealth: Payer: Self-pay | Admitting: Cardiology

## 2022-12-18 NOTE — Telephone Encounter (Signed)
I did not need this encounter. °

## 2023-01-18 ENCOUNTER — Other Ambulatory Visit: Payer: BC Managed Care – PPO

## 2023-04-06 ENCOUNTER — Other Ambulatory Visit: Payer: Self-pay | Admitting: Internal Medicine

## 2023-04-08 NOTE — Telephone Encounter (Signed)
This is a [place] pt.  °

## 2023-04-20 ENCOUNTER — Other Ambulatory Visit: Payer: Self-pay | Admitting: Internal Medicine

## 2023-04-20 NOTE — Telephone Encounter (Signed)
Prescription refill request for Eliquis received. Indication: PAF Last office visit: 09/15/22  Rosette Reveal MD Scr: 0.8 on 01/20/23  [facility] Age: 67 Weight: 94.3kg  Based on above findings Eliquis 5mg  twice daily is the appropriate dose.  Refill approved.

## 2023-04-26 ENCOUNTER — Other Ambulatory Visit: Payer: Self-pay | Admitting: Obstetrics and Gynecology

## 2023-04-26 DIAGNOSIS — Z1231 Encounter for screening mammogram for malignant neoplasm of breast: Secondary | ICD-10-CM

## 2023-07-19 ENCOUNTER — Ambulatory Visit: Payer: Self-pay

## 2023-08-22 IMAGING — MG MM DIGITAL SCREENING BILAT W/ TOMO AND CAD
8 series · 8 of 24 positions shown · non-contrast
Comparison: Previous exam(s).

CLINICAL DATA: Screening.

EXAM:
DIGITAL SCREENING BILATERAL MAMMOGRAM WITH TOMOSYNTHESIS AND CAD
TECHNIQUE: Bilateral screening digital craniocaudal and mediolateral oblique
mammograms were obtained. Bilateral screening digital breast
tomosynthesis was performed. The images were evaluated with
computer-aided detection.

[R MLO synth-2D]
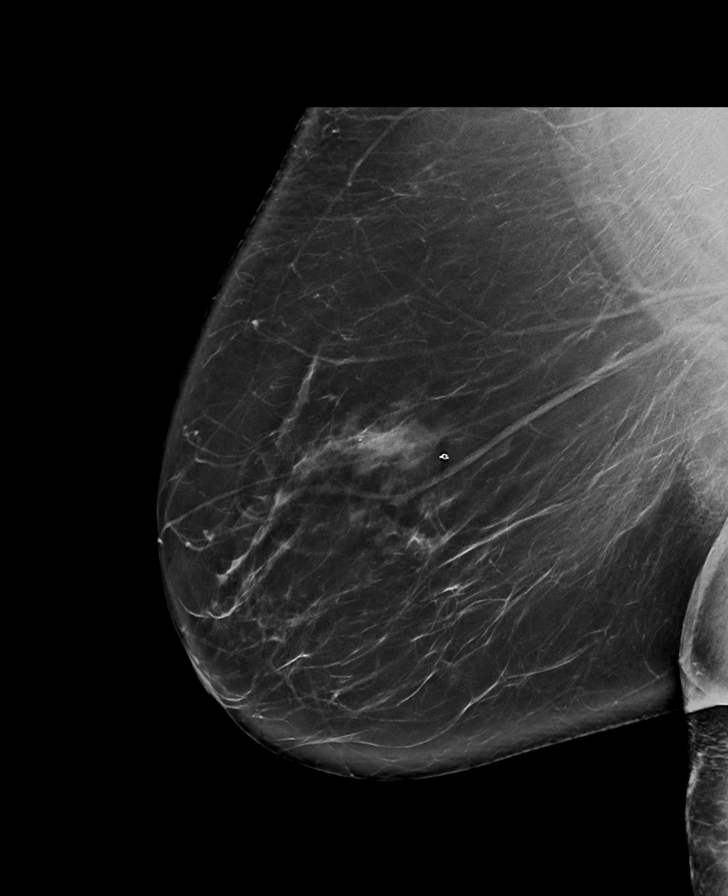

[L MLO synth-2D]
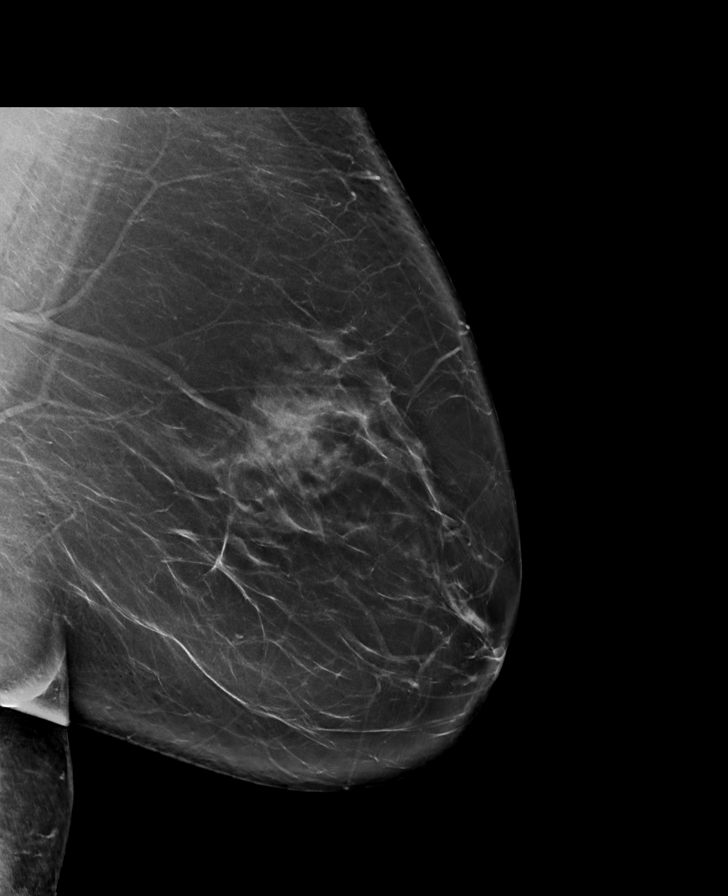

[L CC synth-2D]
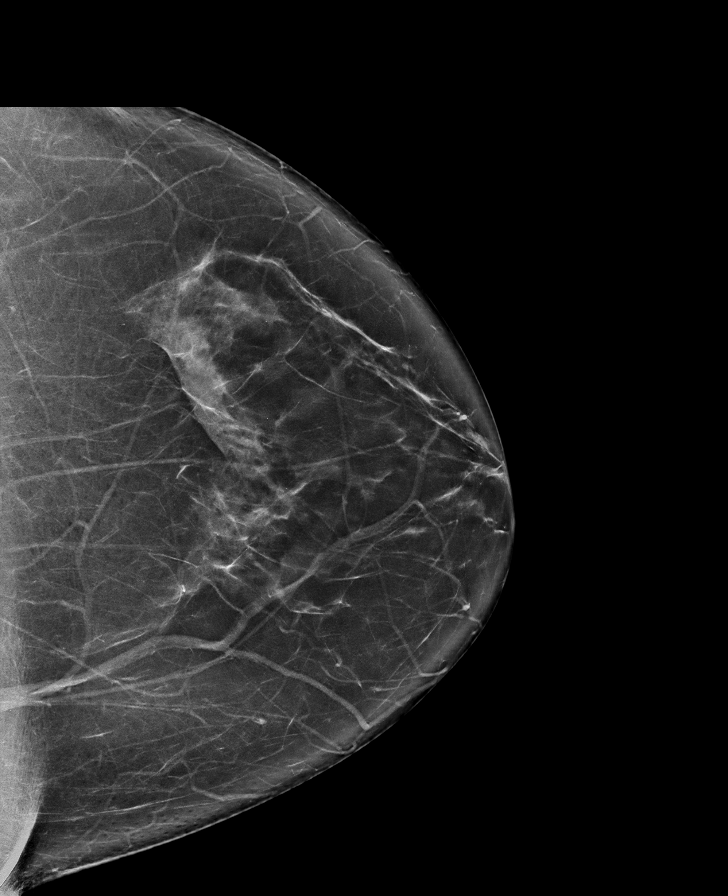

[R CC synth-2D]
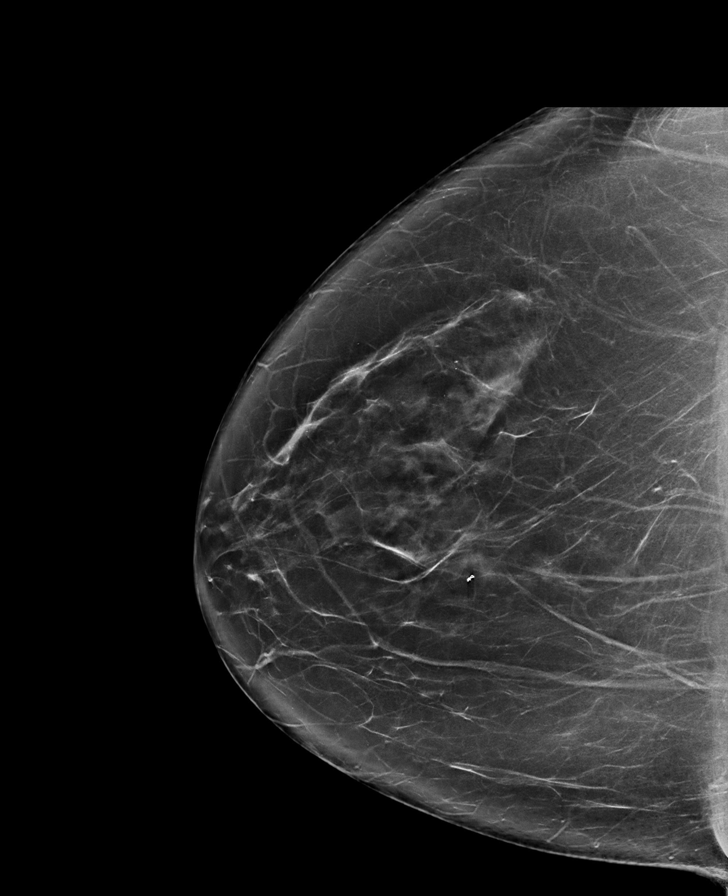

[R MLO tomo · tomo slice 48/95.0]
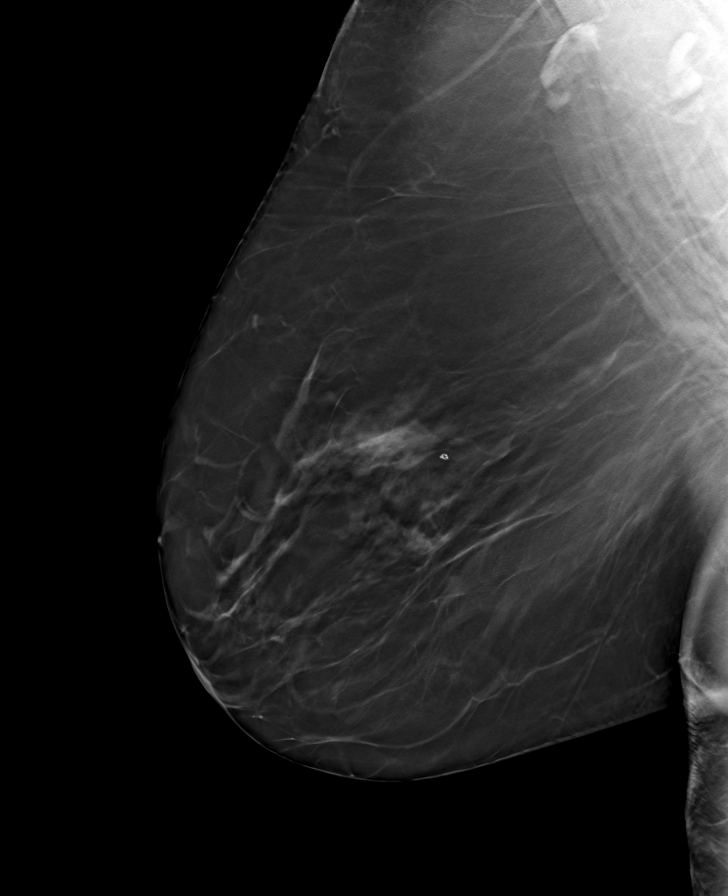

[R CC tomo · tomo slice 43/85.0]
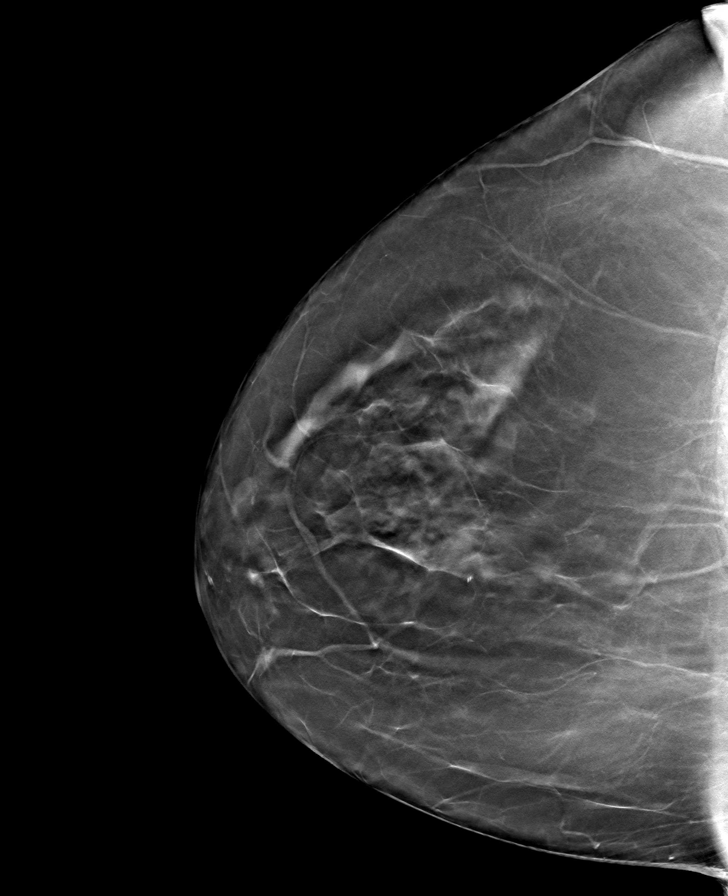

[L CC tomo · tomo slice 43/84.0]
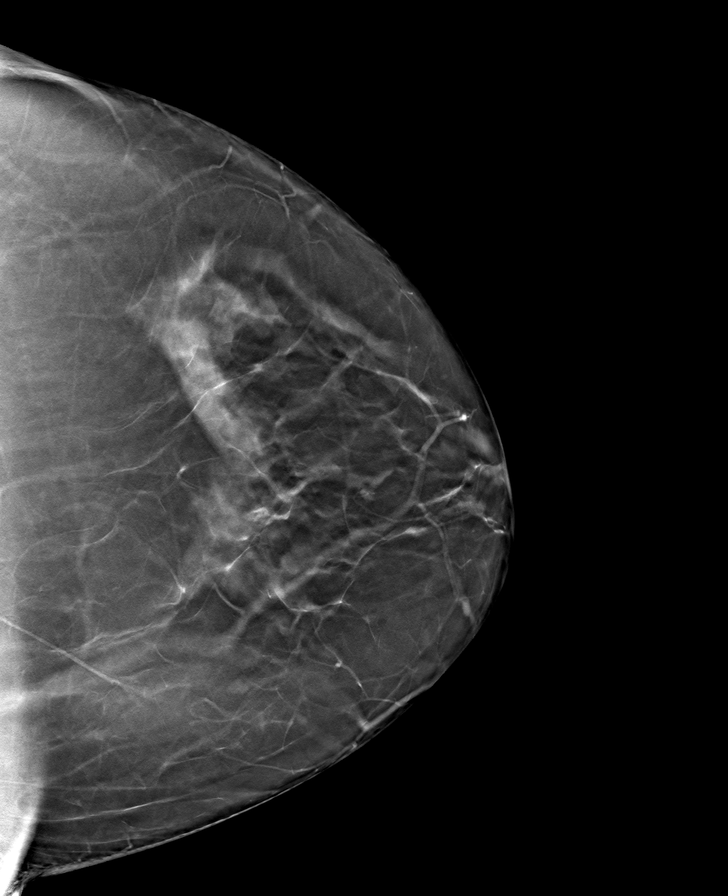

[L MLO tomo · tomo slice 49/96.0]
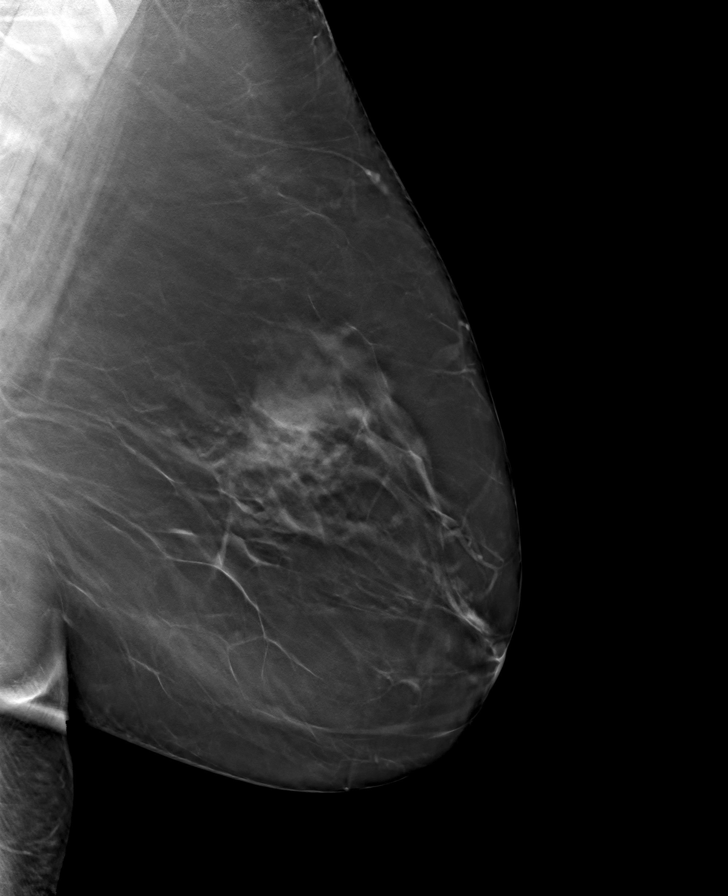

[8 of 24 positions shown; findings below may reference images not displayed]

ACR Breast Density Category b: There are scattered areas of
fibroglandular density.
FINDINGS: There are no findings suspicious for malignancy.
IMPRESSION: No mammographic evidence of malignancy. A result letter of this
screening mammogram will be mailed directly to the patient.

RECOMMENDATION:
Screening mammogram in one year. (Code:51-O-LD2)

BI-RADS CATEGORY  1: Negative.

## 2023-09-22 ENCOUNTER — Other Ambulatory Visit: Payer: Self-pay | Admitting: Internal Medicine

## 2023-11-02 ENCOUNTER — Other Ambulatory Visit: Payer: Self-pay | Admitting: Internal Medicine

## 2024-01-26 ENCOUNTER — Ambulatory Visit
Admission: RE | Admit: 2024-01-26 | Discharge: 2024-01-26 | Disposition: A | Discharge status: Home / Self Care | Payer: Self-pay | Source: Ambulatory Visit | Attending: Obstetrics and Gynecology | Admitting: Obstetrics and Gynecology

## 2024-01-26 DIAGNOSIS — Z1231 Encounter for screening mammogram for malignant neoplasm of breast: Secondary | ICD-10-CM

## 2024-06-14 ENCOUNTER — Other Ambulatory Visit: Payer: Self-pay | Admitting: Student

## 2024-06-14 DIAGNOSIS — M4316 Spondylolisthesis, lumbar region: Secondary | ICD-10-CM

## 2024-07-02 ENCOUNTER — Ambulatory Visit
Admission: RE | Admit: 2024-07-02 | Discharge: 2024-07-02 | Disposition: A | Discharge status: Home / Self Care | Payer: Self-pay | Source: Ambulatory Visit | Attending: Student | Admitting: Student

## 2024-07-02 DIAGNOSIS — M4316 Spondylolisthesis, lumbar region: Secondary | ICD-10-CM

## 2024-07-02 MED ORDER — GADOPICLENOL 0.5 MMOL/ML IV SOLN
10.0000 mL | Freq: Once | INTRAVENOUS | Status: AC | PRN
  Administered 2024-07-02: 10 mL via INTRAVENOUS
